# Patient Record
Sex: Male | Born: 1945
Health system: Southern US, Community
[De-identification: ages and names within clinical notes are randomized; demographics above are authoritative.]

## PROBLEM LIST (undated history)

## (undated) DIAGNOSIS — G459 Transient cerebral ischemic attack, unspecified: Secondary | ICD-10-CM

## (undated) DIAGNOSIS — M1990 Unspecified osteoarthritis, unspecified site: Secondary | ICD-10-CM

## (undated) HISTORY — PX: KNEE SURGERY: SHX244

## (undated) HISTORY — PX: ROTATOR CUFF REPAIR: SHX139

## (undated) HISTORY — PX: BACK SURGERY: SHX140

---

## 2003-03-08 ENCOUNTER — Encounter (HOSPITAL_COMMUNITY): Admission: RE | Admit: 2003-03-08 | Discharge: 2003-04-07 | Payer: Self-pay | Admitting: Orthopedic Surgery

## 2003-04-11 ENCOUNTER — Encounter (HOSPITAL_COMMUNITY): Admission: RE | Admit: 2003-04-11 | Discharge: 2003-05-11 | Payer: Self-pay | Admitting: Orthopedic Surgery

## 2008-10-16 ENCOUNTER — Ambulatory Visit (HOSPITAL_COMMUNITY): Admission: RE | Admit: 2008-10-16 | Discharge: 2008-10-16 | Payer: Self-pay | Admitting: Family Medicine

## 2009-12-26 ENCOUNTER — Ambulatory Visit (HOSPITAL_BASED_OUTPATIENT_CLINIC_OR_DEPARTMENT_OTHER): Admission: RE | Admit: 2009-12-26 | Discharge: 2009-12-26 | Payer: Self-pay | Admitting: Orthopedic Surgery

## 2010-01-02 ENCOUNTER — Encounter (HOSPITAL_COMMUNITY): Admission: RE | Admit: 2010-01-02 | Discharge: 2010-02-01 | Payer: Self-pay | Admitting: Orthopedic Surgery

## 2010-02-04 ENCOUNTER — Encounter (HOSPITAL_COMMUNITY): Admission: RE | Admit: 2010-02-04 | Discharge: 2010-03-08 | Payer: Self-pay | Admitting: Orthopedic Surgery

## 2010-03-11 ENCOUNTER — Encounter (HOSPITAL_COMMUNITY): Admission: RE | Admit: 2010-03-11 | Discharge: 2010-04-10 | Payer: Self-pay | Admitting: Orthopedic Surgery

## 2010-12-23 ENCOUNTER — Encounter (HOSPITAL_BASED_OUTPATIENT_CLINIC_OR_DEPARTMENT_OTHER)
Admission: RE | Admit: 2010-12-23 | Discharge: 2010-12-23 | Disposition: A | Discharge status: Home / Self Care | Payer: 59 | Source: Ambulatory Visit | Attending: Orthopedic Surgery | Admitting: Orthopedic Surgery

## 2010-12-23 DIAGNOSIS — Z01810 Encounter for preprocedural cardiovascular examination: Secondary | ICD-10-CM | POA: Insufficient documentation

## 2010-12-26 ENCOUNTER — Ambulatory Visit (HOSPITAL_BASED_OUTPATIENT_CLINIC_OR_DEPARTMENT_OTHER)
Admission: RE | Admit: 2010-12-26 | Discharge: 2010-12-26 | Disposition: A | Discharge status: Home / Self Care | Payer: 59 | Source: Ambulatory Visit | Attending: Orthopedic Surgery | Admitting: Orthopedic Surgery

## 2010-12-26 DIAGNOSIS — Z01812 Encounter for preprocedural laboratory examination: Secondary | ICD-10-CM | POA: Insufficient documentation

## 2010-12-26 DIAGNOSIS — M23359 Other meniscus derangements, posterior horn of lateral meniscus, unspecified knee: Secondary | ICD-10-CM | POA: Insufficient documentation

## 2010-12-26 DIAGNOSIS — M23305 Other meniscus derangements, unspecified medial meniscus, unspecified knee: Secondary | ICD-10-CM | POA: Insufficient documentation

## 2010-12-26 DIAGNOSIS — M2340 Loose body in knee, unspecified knee: Secondary | ICD-10-CM | POA: Insufficient documentation

## 2010-12-26 DIAGNOSIS — F17200 Nicotine dependence, unspecified, uncomplicated: Secondary | ICD-10-CM | POA: Insufficient documentation

## 2010-12-26 LAB — POCT HEMOGLOBIN-HEMACUE: Hemoglobin: 16.3 g/dL (ref 13.0–17.0)

## 2010-12-30 NOTE — Op Note (Signed)
  NAME:  CHOU, BUSLER NO.:  [id number]  MEDICAL RECORD NO.:  [id number]            PATIENT TYPE:  LOCATION:                                 FACILITY:  PHYSICIAN:  Loreta Ave, M.D.      DATE OF BIRTH:  DATE OF PROCEDURE:  12/26/2010 DATE OF DISCHARGE:                              OPERATIVE REPORT   PREOPERATIVE DIAGNOSIS:  Left knee medial and lateral meniscus tears.  POSTOPERATIVE DIAGNOSIS:  Left knee medial and lateral meniscus tears, also with grade 3 changes tricompartmental.  PROCEDURE:  Left knee exam under anesthesia, arthroscopy, partial medial and lateral meniscectomy.  Tricompartmental chondroplasty for grade 3 changes.  SURGEON:  Loreta Ave, MD  ASSISTANT:  Genene Churn. Barry Dienes, PA  ANESTHESIA:  General.  BLOOD LOSS:  Minimal.  SPECIMEN:  None.  CULTURES:  None.  COMPLICATIONS:  None.  DRESSING:  Soft compressive.  TOURNIQUET:  Not employed.  PROCEDURE:  The patient brought to the operating room, placed on the operating table in supine position.  After adequate anesthesia had been obtained, tourniquet and leg holder applied.  Leg prepped and draped in usual sterile fashion.  Good motion and stability confirmed.  Two portals, one each medial and lateral parapatellar.  Arthroscope introduced.  Knee distended and inspected.  Good patellofemoral tracking but grade 2 changes on the patella and diffuse grade 3 changes centrally in the trochlea.  Chondroplasty to a stable surface.  Chondral loose bodies removed.  Cruciate ligaments intact.  Medial compartment and extensive complex tearing medial meniscus, portions of this flipped up behind the condyle over into the notch.  Almost all of the posterior half removed, tapered into main meniscus.  Diffuse grade 3 changes over most of weightbearing dome of the condyle, debrided to a stable surface. Tibial plateau looked reasonable.  Lateral meniscus had complex tearing throughout the  middle and posterior third.  Saucerized out to a stable rim, tapered into remaining meniscus.  There were some grade 3 changes on the margin of the condyle but the rest of the compartment reasonably good.  At completion, all recess examined to be sure all chondral fragments removed.  Instruments and fluid removed.  Knee injected with Depo-Medrol and Marcaine.  Portals closed with nylon.  Sterile compressive dressing applied.  Anesthesia reversed. Brought to recovery room.  Tolerated surgery well.  No complications.     Loreta Ave, M.D.     DFM/MEDQ  D:  12/26/2010  T:  12/27/2010  Job:  [id number]  Electronically Signed by Mckinley Jewel M.D. on 12/30/2010 02:59:47 PM

## 2012-11-10 DIAGNOSIS — G459 Transient cerebral ischemic attack, unspecified: Secondary | ICD-10-CM

## 2012-11-10 HISTORY — DX: Transient cerebral ischemic attack, unspecified: G45.9

## 2015-02-23 ENCOUNTER — Emergency Department (HOSPITAL_COMMUNITY): Payer: Medicare Other

## 2015-02-23 ENCOUNTER — Observation Stay (HOSPITAL_COMMUNITY): Payer: Medicare Other

## 2015-02-23 ENCOUNTER — Emergency Department (HOSPITAL_COMMUNITY)
Admission: EM | Admit: 2015-02-23 | Discharge: 2015-02-23 | Disposition: A | Discharge status: Home / Self Care | Payer: Self-pay | Source: Home / Self Care

## 2015-02-23 ENCOUNTER — Encounter (HOSPITAL_COMMUNITY): Payer: Self-pay | Admitting: *Deleted

## 2015-02-23 ENCOUNTER — Observation Stay (HOSPITAL_COMMUNITY)
Admission: EM | Admit: 2015-02-23 | Discharge: 2015-02-24 | Disposition: A | Discharge status: Home / Self Care | Payer: Medicare Other | Attending: Internal Medicine | Admitting: Internal Medicine

## 2015-02-23 DIAGNOSIS — R202 Paresthesia of skin: Secondary | ICD-10-CM | POA: Diagnosis not present

## 2015-02-23 DIAGNOSIS — I10 Essential (primary) hypertension: Secondary | ICD-10-CM

## 2015-02-23 DIAGNOSIS — G458 Other transient cerebral ischemic attacks and related syndromes: Secondary | ICD-10-CM | POA: Diagnosis not present

## 2015-02-23 DIAGNOSIS — Z720 Tobacco use: Secondary | ICD-10-CM | POA: Diagnosis present

## 2015-02-23 DIAGNOSIS — R739 Hyperglycemia, unspecified: Secondary | ICD-10-CM | POA: Diagnosis not present

## 2015-02-23 DIAGNOSIS — D696 Thrombocytopenia, unspecified: Secondary | ICD-10-CM | POA: Diagnosis present

## 2015-02-23 DIAGNOSIS — G459 Transient cerebral ischemic attack, unspecified: Principal | ICD-10-CM | POA: Diagnosis present

## 2015-02-23 DIAGNOSIS — R209 Unspecified disturbances of skin sensation: Secondary | ICD-10-CM | POA: Diagnosis present

## 2015-02-23 DIAGNOSIS — E785 Hyperlipidemia, unspecified: Secondary | ICD-10-CM | POA: Diagnosis not present

## 2015-02-23 DIAGNOSIS — F17210 Nicotine dependence, cigarettes, uncomplicated: Secondary | ICD-10-CM | POA: Diagnosis not present

## 2015-02-23 DIAGNOSIS — R200 Anesthesia of skin: Secondary | ICD-10-CM | POA: Diagnosis not present

## 2015-02-23 LAB — COMPREHENSIVE METABOLIC PANEL
ALT: 19 U/L (ref 0–53)
AST: 26 U/L (ref 0–37)
Albumin: 3.8 g/dL (ref 3.5–5.2)
Alkaline Phosphatase: 87 U/L (ref 39–117)
Anion gap: 10 (ref 5–15)
BUN: 20 mg/dL (ref 6–23)
CHLORIDE: 104 mmol/L (ref 96–112)
CO2: 24 mmol/L (ref 19–32)
CREATININE: 0.81 mg/dL (ref 0.50–1.35)
Calcium: 9.3 mg/dL (ref 8.4–10.5)
GFR calc Af Amer: >90 mL/min (ref >90)
Glucose, Bld: 82 mg/dL (ref 70–99)
Potassium: 3.8 mmol/L (ref 3.5–5.1)
Sodium: 138 mmol/L (ref 135–145)
Total Bilirubin: 0.9 mg/dL (ref 0.3–1.2)
Total Protein: 6.6 g/dL (ref 6.0–8.3)

## 2015-02-23 LAB — CBC
HCT: 45 % (ref 39.0–52.0)
Hemoglobin: 15.6 g/dL (ref 13.0–17.0)
MCH: 31.9 pg (ref 26.0–34.0)
MCHC: 34.7 g/dL (ref 30.0–36.0)
MCV: 92 fL (ref 78.0–100.0)
PLATELETS: 138 10*3/uL — AB (ref 150–400)
RBC: 4.89 MIL/uL (ref 4.22–5.81)
RDW: 13.3 % (ref 11.5–15.5)
WBC: 9.3 10*3/uL (ref 4.0–10.5)

## 2015-02-23 LAB — URINALYSIS, ROUTINE W REFLEX MICROSCOPIC
BILIRUBIN URINE: NEGATIVE
GLUCOSE, UA: NEGATIVE mg/dL
Hgb urine dipstick: NEGATIVE
Ketones, ur: 15 mg/dL — AB
Leukocytes, UA: NEGATIVE
NITRITE: NEGATIVE
PH: 7 (ref 5.0–8.0)
Protein, ur: NEGATIVE mg/dL
SPECIFIC GRAVITY, URINE: 1.01 (ref 1.005–1.030)
Urobilinogen, UA: 0.2 mg/dL (ref 0.0–1.0)

## 2015-02-23 LAB — DIFFERENTIAL
Basophils Absolute: 0 10*3/uL (ref 0.0–0.1)
Basophils Relative: 0 % (ref 0–1)
EOS PCT: 2 % (ref 0–5)
Eosinophils Absolute: 0.2 10*3/uL (ref 0.0–0.7)
LYMPHS ABS: 2.3 10*3/uL (ref 0.7–4.0)
LYMPHS PCT: 25 % (ref 12–46)
MONO ABS: 0.7 10*3/uL (ref 0.1–1.0)
Monocytes Relative: 7 % (ref 3–12)
Neutro Abs: 6.1 10*3/uL (ref 1.7–7.7)
Neutrophils Relative %: 66 % (ref 43–77)

## 2015-02-23 LAB — PROTIME-INR
INR: 1.15 (ref 0.00–1.49)
Prothrombin Time: 14.8 seconds (ref 11.6–15.2)

## 2015-02-23 LAB — APTT: aPTT: 36 seconds (ref 24–37)

## 2015-02-23 LAB — RAPID URINE DRUG SCREEN, HOSP PERFORMED
Amphetamines: NOT DETECTED
Barbiturates: NOT DETECTED
Benzodiazepines: NOT DETECTED
COCAINE: NOT DETECTED
OPIATES: NOT DETECTED
Tetrahydrocannabinol: NOT DETECTED

## 2015-02-23 LAB — GLUCOSE, CAPILLARY: GLUCOSE-CAPILLARY: 145 mg/dL — AB (ref 70–99)

## 2015-02-23 LAB — TROPONIN I

## 2015-02-23 LAB — ETHANOL

## 2015-02-23 MED ORDER — HEPARIN SODIUM (PORCINE) 5000 UNIT/ML IJ SOLN: 5000.0000 [IU] | Freq: Three times a day (TID) | INTRAMUSCULAR | Status: DC

## 2015-02-23 MED ORDER — STROKE: EARLY STAGES OF RECOVERY BOOK
Freq: Once | Status: AC
  Administered 2015-02-23: 18:00:00

## 2015-02-23 MED ORDER — ENOXAPARIN SODIUM 40 MG/0.4ML ~~LOC~~ SOLN: 40.0000 mg | SUBCUTANEOUS | Status: DC

## 2015-02-23 MED ORDER — ASPIRIN 325 MG PO TABS
325.0000 mg | ORAL_TABLET | Freq: Every day | ORAL | Status: DC
  Administered 2015-02-23: 325 mg via ORAL
  Filled 2015-02-23: qty 1

## 2015-02-23 MED ORDER — ASPIRIN 81 MG PO CHEW
81.0000 mg | CHEWABLE_TABLET | Freq: Every day | ORAL | Status: DC
Start: 2015-02-24 — End: 2015-02-24
  Administered 2015-02-24: 81 mg via ORAL
  Filled 2015-02-23: qty 1

## 2015-02-23 MED ORDER — HEPARIN SODIUM (PORCINE) 5000 UNIT/ML IJ SOLN
5000.0000 [IU] | Freq: Three times a day (TID) | INTRAMUSCULAR | Status: DC
Start: 2015-02-24 — End: 2015-02-24
  Administered 2015-02-24: 5000 [IU] via SUBCUTANEOUS
  Filled 2015-02-23: qty 1

## 2015-02-23 NOTE — ED Notes (Signed)
Pt reports having an episode today of numbness/tingling to left arm and leg. Episode started around 1130 and lasted approx 1 hour. Denies having any symptoms at this time. Grips are equal, speech clear,  No facial droop noted.

## 2015-02-23 NOTE — ED Provider Notes (Signed)
CSN: [id number]     Arrival date & time 02/23/15  1441 History   First MD Initiated Contact with Patient 02/23/15 [phone]     Chief Complaint  Patient presents with  . Numbness     Patient is a 69 y.o. male presenting with general illness. The history is provided by the patient.  Illness Location:  Left face, left arm, left leg Quality:  Numbness Severity:  Moderate Onset quality:  Sudden Duration: 15 minutes. Timing:  Constant Progression:  Resolved Chronicity:  New Relieved by:  Nothing Worsened by:  Nothing Associated symptoms: no abdominal pain, no chest pain, no fatigue, no fever, no headaches, no loss of consciousness, no shortness of breath and no vomiting   Patient presents with left sided numbness He reports he has had multiple episodes of left face/arm/leg numbness Now resolved Episodes lasted up to 15 minutes No weakness No slurred speech No h/o CVA He now feels at baseline     PMH - none Soc hx - smoker Family history - negative for stroke  History  Substance Use Topics  . Smoking status: Current Every Day Smoker    Types: Cigarettes  . Smokeless tobacco: Not on file  . Alcohol Use: No    Review of Systems  Constitutional: Negative for fever and fatigue.  Eyes: Negative for visual disturbance.  Respiratory: Negative for shortness of breath.   Cardiovascular: Negative for chest pain.  Gastrointestinal: Negative for vomiting and abdominal pain.  Musculoskeletal: Negative for neck pain.  Neurological: Positive for numbness. Negative for loss of consciousness, weakness and headaches.  All other systems reviewed and are negative.     Allergies  Review of patient's allergies indicates no known allergies.  Home Medications   Prior to Admission medications   Not on File   BP 173/95 mmHg  Pulse 63  Temp(Src) 97.8 F (36.6 C) (Oral)  Resp 18  Ht 6\' 2"  (1.88 m)  SpO2 97% Physical Exam CONSTITUTIONAL: Well developed/well nourished HEAD:  Normocephalic/atraumatic EYES: EOMI/PERRL, no nystagmus,  no ptosis ENMT: Mucous membranes moist NECK: supple no meningeal signs, no bruits CV: S1/S2 noted, no murmurs/rubs/gallops noted LUNGS: Lungs are clear to auscultation bilaterally, no apparent distress ABDOMEN: soft, nontender, no rebound or guarding GU:no cva tenderness NEURO:Awake/alert, facies symmetric, no arm or leg drift is noted Equal 5/5 strength with shoulder abduction, elbow flex/extension, wrist flex/extension in upper extremities and equal hand grips bilaterally Equal 5/5 strength with hip flexion,knee flex/extension, foot dorsi/plantar flexion Cranial nerves 3/4/5/6/05/18/09/11/12 tested and intact No past pointing Sensation to light touch intact in all extremities EXTREMITIES: pulses normal, full ROM SKIN: warm, color normal PSYCH: no abnormalities of mood noted  ED Course  Procedures  3:22 PM tPA in stroke considered but not given due to: Symptoms resolved 4:41 PM CT head negative Pt symptom free Will admit for TIA  Labs Review Labs Reviewed  CBC - Abnormal; Notable for the following:    Platelets 138 (*)    All other components within normal limits  PROTIME-INR  APTT  DIFFERENTIAL  ETHANOL  COMPREHENSIVE METABOLIC PANEL  URINE RAPID DRUG SCREEN (HOSP PERFORMED)  URINALYSIS, ROUTINE W REFLEX MICROSCOPIC  TROPONIN I    Imaging Review Ct Head Wo Contrast  02/23/2015   CLINICAL DATA:  Left-sided numbness/tingling  EXAM: CT HEAD WITHOUT CONTRAST  TECHNIQUE: Contiguous axial images were obtained from the base of the skull through the vertex without intravenous contrast.  COMPARISON:  None.  FINDINGS: No evidence of parenchymal hemorrhage or extra-axial  fluid collection. No mass lesion, mass effect, or midline shift.  No CT evidence of acute infarction.  Cerebral volume is within normal limits.  No ventriculomegaly.  The visualized paranasal sinuses are essentially clear. Partial opacification of the right  mastoid air cells.  No evidence of calvarial fracture.  IMPRESSION: Normal head CT.   Electronically Signed   By: Julian Hy M.D.   On: 02/23/2015 15:54     EKG Interpretation   Date/Time:  Friday February 23 2015 16:01:49 EDT Ventricular Rate:  61 PR Interval:  161 QRS Duration: 110 QT Interval:  446 QTC Calculation: 449 R Axis:   26 Text Interpretation:  Sinus rhythm Consider left atrial enlargement  Probable left ventricular hypertrophy No significant change since last  tracing Confirmed by Christy Gentles  MD, Elenore Rota ([id number]) on 02/23/2015 4:28:51 PM      MDM   Final diagnoses:  Transient cerebral ischemia, unspecified transient cerebral ischemia type    Nursing notes including past medical history and social history reviewed and considered in documentation Labs/vital reviewed myself and considered during evaluation  .    Ripley Fraise, MD 02/23/15 [phone]

## 2015-02-23 NOTE — Progress Notes (Signed)
Pt received from ED alert, verbal with no noted distress. He denies pain or discomfort. Denies numbness or tingling but states that he did have numbness and tingling to his left upper and lower extremities prior to driving self to ED. Marland Kitchen Pt stable, neuro intact.  Able to move all extremities. Pt oriented to room. Safety measures in place. Call bell within reach. Will continue to monitor.

## 2015-02-23 NOTE — H&P (Signed)
Date: 02/23/2015               Patient Name:  Phillip Richardson MRN: [id number]  DOB: [birth year] Age / Sex: 69 y.o., male   PCP: No primary care provider on file.         Medical Service: Internal Medicine Teaching Service         Attending Physician: Dr. Madilyn Fireman, MD    First Contact: Dr. Randell Patient Pager: [phone]  Second Contact: Dr. Naaman Plummer Pager: [phone]       After Hours (After 5p/  First Contact Pager: [phone]  weekends / holidays): Second Contact Pager: [phone]   Chief Complaint: Left sided numbness/tingling  History of Present Illness: Phillip Richardson is a 69 year old tobacco user presenting with left sided numbness/tingling. He reports his symptoms started around noon today with numbness on the left side of his face, tingling in his left fingers, and numbness in the left leg. This lasted for 15-20 minutes. It returned 1 hour later and lasted again for 15-20 minutes. Last episode was at [facility] with tingling in fingers. Symptoms have all resolved completely. Denies weakness. Denies past episodes. Denies fevers, chills, vision changes, trouble swallowing, coughing, blurred speech, dizziness, lightheadedness.  Meds: MVI Fish oil  Allergies: Allergies as of 02/23/2015  . (No Known Allergies)   History reviewed. No pertinent past medical history. History reviewed. No pertinent past surgical history. History reviewed. No pertinent family history. Denies family history of CVA.  History   Social History  . Marital Status: Widowed    Spouse Name: N/A  . Number of Children: N/A  . Years of Education: N/A   Occupational History  . Not on file.   Social History Main Topics  . Smoking status: Current Every Day Smoker    Types: Cigarettes  . Smokeless tobacco: Not on file  . Alcohol Use: No  . Drug Use: No  . Sexual Activity: Not on file   Other Topics Concern  . Not on file   Social History Narrative  . No narrative on file  Tobacco: 0.5 PPD for 50-60  years EtOH: occasional beer Retired, previously worked in [employer] [place]: Constitutional: no fevers/chills, no weight loss, no change in appetite Eyes: no vision changes Ears, nose, mouth, throat, and face: no cough, no dysphagia, no aphasia Respiratory: no shortness of breath Cardiovascular: no chest pain Gastrointestinal: no nausea/vomiting, no abdominal pain, no constipation, no diarrhea Genitourinary: no dysuria, no hematuria Integument: no rash Hematologic/lymphatic: no bleeding/bruising, no edema Musculoskeletal: no arthralgias, no myalgias Neurological: no paresthesias, no weakness  Physical Exam: Blood pressure 175/95, pulse 64, temperature 97.7 F (36.5 C), temperature source Oral, resp. rate 18, height 6\' 2"  (1.88 m), SpO2 96 %. General Apperance: NAD Head: Normocephalic, atraumatic Eyes: PERRL, EOMI, anicteric sclera Ears: Normal external ear canal Nose: Nares normal, septum midline, mucosa normal Throat: Lips, mucosa and tongue normal  Neck: Supple, trachea midline Back: No tenderness or bony abnormality  Lungs: Clear to auscultation bilaterally. No wheezes, rhonchi or rales. Breathing comfortably on RA Chest Wall: Nontender, no deformity Heart: Regular rate and rhythm, no murmur/rub/gallop Abdomen: Soft, nontender, nondistended, no rebound/guarding Extremities: Normal, atraumatic, warm and well perfused, no edema Pulses: 2+ throughout Skin: No rashes or lesions Neurologic: Alert and oriented x 3. Speech fluent without aphasia. CNII-XII intact. Normal strength and sensation bilaterally. DTR 1+ and symmetric.   Lab results: Basic Metabolic Panel:  Recent Labs  02/23/15 1534  NA 138  K 3.8  CL 104  CO2 24  GLUCOSE 82  BUN 20  CREATININE 0.81  CALCIUM 9.3   Liver Function Tests:  Recent Labs  02/23/15 1534  AST 26  ALT 19  ALKPHOS 87  BILITOT 0.9  PROT 6.6  ALBUMIN 3.8   CBC:  Recent Labs  02/23/15 1534  WBC 9.3   NEUTROABS 6.1  HGB 15.6  HCT 45.0  MCV 92.0  PLT 138*   Cardiac Enzymes:  Recent Labs  02/23/15 1534  TROPONINI <0.03   Coagulation:  Recent Labs  02/23/15 1534  LABPROT 14.8  INR 1.15   Urine Drug Screen: Drugs of Abuse     Component Value Date/Time   LABOPIA NONE DETECTED 02/23/2015 1625   COCAINSCRNUR NONE DETECTED 02/23/2015 1625   LABBENZ NONE DETECTED 02/23/2015 1625   AMPHETMU NONE DETECTED 02/23/2015 1625   THCU NONE DETECTED 02/23/2015 1625   LABBARB NONE DETECTED 02/23/2015 1625    Alcohol Level:  Recent Labs  02/23/15 1534  ETH <5   Urinalysis:  Recent Labs  02/23/15 1625  COLORURINE YELLOW  LABSPEC 1.010  PHURINE 7.0  GLUCOSEU NEGATIVE  HGBUR NEGATIVE  BILIRUBINUR NEGATIVE  KETONESUR 15*  PROTEINUR NEGATIVE  UROBILINOGEN 0.2  NITRITE NEGATIVE  LEUKOCYTESUR NEGATIVE     Imaging results:  Ct Head Wo Contrast  02/23/2015   CLINICAL DATA:  Left-sided numbness/tingling  EXAM: CT HEAD WITHOUT CONTRAST  TECHNIQUE: Contiguous axial images were obtained from the base of the skull through the vertex without intravenous contrast.  COMPARISON:  None.  FINDINGS: No evidence of parenchymal hemorrhage or extra-axial fluid collection. No mass lesion, mass effect, or midline shift.  No CT evidence of acute infarction.  Cerebral volume is within normal limits.  No ventriculomegaly.  The visualized paranasal sinuses are essentially clear. Partial opacification of the right mastoid air cells.  No evidence of calvarial fracture.  IMPRESSION: Normal head CT.   Electronically Signed   By: Julian Hy M.D.   On: 02/23/2015 15:54    Other results: EKG: Normal sinus rhythm. No previous EKG for comparison.  Assessment & Plan by Problem: Active Problems:   TIA (transient ischemic attack)  TIA: He has a risk factor for stroke which is active smoking. CT head demonstrates no acute processes. EKG demonstrates no acute ischemic changes. Initial troponin  negative. ABCD2 score 3 - low 2 day stroke risk (1 percent) -Carotid dopplers -Echo -MRI/MRA head -Obtain HgbA1c, fasting lipid panel -PT/OT, Speech consult  -ASA 325mg  daily -Telemetry monitoring -Neurology consulted  Hypertension: No previous diagnosis. BP 175/93 on admission.  -Allow permissive hypertension  FEN: Regular diet DVT ppx: SCDs  Dispo: Disposition is deferred at this time, awaiting improvement of current medical problems. Anticipated discharge in approximately 1-2 day(s).   The patient does not have a current PCP (No primary care provider on file.) and does not need an [facility] hospital follow-up appointment after discharge.  The patient does not have transportation limitations that hinder transportation to clinic appointments.  Signed: Milagros Loll, MD 02/23/2015, 4:40 PM

## 2015-02-23 NOTE — ED Notes (Signed)
Patient states he had an episode of numbness and tingling on the left side of his body. States that his face "felt like I had been to the dentist and I had some tingling in my hand." pt states he remember episode of numbness, denies any confusion, weakness in any limb or trouble speaking. States this is the first time he has ever had an episode like this, denies headache at this time. Pt alert, oriented x 4

## 2015-02-23 NOTE — Consult Note (Signed)
Referring Physician: Dr. Ellwood Dense    Chief Complaint: TIA (left arm-face-leg numbness)  HPI:                                                                                                                                         Phillip Richardson is an 69 y.o. male with a past medical history significant for smoking, admitted to [facility] after sustaining a transient episode of left sided paresthesias. Patient said that he never had similar symptoms before. Stated that he was driving when suddenly noticed a numb-tingly sensation of the fingers left hand and almost simultaneously the same sensation developed in his entire left arm-leg-face. Said that " it was like when you got a novocain shot at the dentist office". The abnormal sensation lasted for 10 or 15 minutes but came back again1 hour later and lasted again for 15-20 minutes. Last episode was at [facility] with tingling in fingers. Denies associated HA, vertigo, double vision, difficulty swallowing, focal weakness, slurred speech, language or vision impairment. Said that as soon as he got home he took 2 aspirin. CT brain was personally reviewed and showed no acute abnormality. CBC, metabolic panel, UDS,and ETOH level were unrevealing.  Date last known well: 02/23/15 Time last known well: unclear tPA Given: no, symptoms resolved  History reviewed. No pertinent past medical history.  History reviewed. No pertinent past surgical history.  History reviewed. No pertinent family history. Social History:  reports that he has been smoking Cigarettes.  He does not have any smokeless tobacco history on file. He reports that he does not drink alcohol or use illicit drugs. Family history: no brain tumors, brain aneurysms, or epilepsy Allergies: No Known Allergies  Medications:                                                                                                                           Scheduled: . [COMPLETED]  stroke: mapping our early stages of  recovery book   Does not apply Once  . aspirin  325 mg Oral Daily    ROS:  History obtained from the patient and chart review  General ROS: negative for - chills, fatigue, fever, night sweats, weight gain or weight loss Psychological ROS: negative for - behavioral disorder, hallucinations, memory difficulties, mood swings or suicidal ideation Ophthalmic ROS: negative for - blurry vision, double vision, eye pain or loss of vision ENT ROS: negative for - epistaxis, nasal discharge, oral lesions, sore throat, tinnitus or vertigo Allergy and Immunology ROS: negative for - hives or itchy/watery eyes Hematological and Lymphatic ROS: negative for - bleeding problems, bruising or swollen lymph nodes Endocrine ROS: negative for - galactorrhea, hair pattern changes, polydipsia/polyuria or temperature intolerance Respiratory ROS: negative for - cough, hemoptysis, shortness of breath or wheezing Cardiovascular ROS: negative for - chest pain, dyspnea on exertion, edema or irregular heartbeat Gastrointestinal ROS: negative for - abdominal pain, diarrhea, hematemesis, nausea/vomiting or stool incontinence Genito-Urinary ROS: negative for - dysuria, hematuria, incontinence or urinary frequency/urgency Musculoskeletal ROS: negative for - joint swelling or muscular weakness Neurological ROS: as noted in HPI Dermatological ROS: negative for rash and skin lesion changes   Physical exam: pleasant male in no apparent distress. Blood pressure 180/98, pulse 65, temperature 97.7 F (36.5 C), temperature source Oral, resp. rate 17, height $RemoveBe'6\' 2"'[id number]$  (1.88 m), SpO2 94 %. Head: normocephalic. Eyes: anicteric sclerae  Ear: no discharge Neck: supple, no bruits, no JVD. Cardiac: no murmurs. Lungs: clear. Abdomen: soft, no tender, no mass. Extremities: no edema. CV: pulses palpable  throughout  Skin: no rash  Neurologic Examination:                                                                                                      General: Mental Status: Alert, oriented, thought content appropriate.  Speech fluent without evidence of aphasia.  Able to follow 3 step commands without difficulty. Cranial Nerves: II: Discs flat bilaterally; Visual fields grossly normal, pupils equal, round, reactive to light and accommodation III,IV, VI: ptosis not present, extra-ocular motions intact bilaterally V,VII: smile symmetric, facial light touch sensation normal bilaterally VIII: hearing normal bilaterally IX,X: uvula rises symmetrically XI: bilateral shoulder shrug XII: midline tongue extension without atrophy or fasciculations  Motor: Right : Upper extremity   5/5    Left:     Upper extremity   5/5  Lower extremity   5/5     Lower extremity   5/5 Tone and bulk:normal tone throughout; no atrophy noted Sensory: Pinprick and light touch intact throughout, bilaterally Deep Tendon Reflexes:  Right: Upper Extremity   Left: Upper extremity   biceps (C-5 to C-6) 2/4   biceps (C-5 to C-6) 2/4 tricep (C7) 2/4    triceps (C7) 2/4 Brachioradialis (C6) 2/4  Brachioradialis (C6) 2/4  Lower Extremity Lower Extremity  quadriceps (L-2 to L-4) 2/4   quadriceps (L-2 to L-4) 2/4 Achilles (S1) 2/4   Achilles (S1) 2/4  Plantars: Right: downgoing   Left: downgoing Cerebellar: normal finger-to-nose,  normal heel-to-shin test Gait:  No tested due to multiple leads, safety resons    Results for orders placed or performed during the hospital encounter of 02/23/15 (from  the past 48 hour(s))  Ethanol     Status: None   Collection Time: 02/23/15  3:34 PM  Result Value Ref Range   Alcohol, Ethyl (B) <5 0 - 9 mg/dL    Comment:        LOWEST DETECTABLE LIMIT FOR SERUM ALCOHOL IS 11 mg/dL FOR MEDICAL PURPOSES ONLY   Protime-INR     Status: None   Collection Time: 02/23/15  3:34 PM   Result Value Ref Range   Prothrombin Time 14.8 11.6 - 15.2 seconds   INR 1.15 0.00 - 1.49  APTT     Status: None   Collection Time: 02/23/15  3:34 PM  Result Value Ref Range   aPTT 36 24 - 37 seconds  CBC     Status: Abnormal   Collection Time: 02/23/15  3:34 PM  Result Value Ref Range   WBC 9.3 4.0 - 10.5 K/uL   RBC 4.89 4.22 - 5.81 MIL/uL   Hemoglobin 15.6 13.0 - 17.0 g/dL   HCT 45.0 39.0 - 52.0 %   MCV 92.0 78.0 - 100.0 fL   MCH 31.9 26.0 - 34.0 pg   MCHC 34.7 30.0 - 36.0 g/dL   RDW 13.3 11.5 - 15.5 %   Platelets 138 (L) 150 - 400 K/uL  Differential     Status: None   Collection Time: 02/23/15  3:34 PM  Result Value Ref Range   Neutrophils Relative % 66 43 - 77 %   Neutro Abs 6.1 1.7 - 7.7 K/uL   Lymphocytes Relative 25 12 - 46 %   Lymphs Abs 2.3 0.7 - 4.0 K/uL   Monocytes Relative 7 3 - 12 %   Monocytes Absolute 0.7 0.1 - 1.0 K/uL   Eosinophils Relative 2 0 - 5 %   Eosinophils Absolute 0.2 0.0 - 0.7 K/uL   Basophils Relative 0 0 - 1 %   Basophils Absolute 0.0 0.0 - 0.1 K/uL  Comprehensive metabolic panel     Status: None   Collection Time: 02/23/15  3:34 PM  Result Value Ref Range   Sodium 138 135 - 145 mmol/L   Potassium 3.8 3.5 - 5.1 mmol/L   Chloride 104 96 - 112 mmol/L   CO2 24 19 - 32 mmol/L   Glucose, Bld 82 70 - 99 mg/dL   BUN 20 6 - 23 mg/dL   Creatinine, Ser 0.81 0.50 - 1.35 mg/dL   Calcium 9.3 8.4 - 10.5 mg/dL   Total Protein 6.6 6.0 - 8.3 g/dL   Albumin 3.8 3.5 - 5.2 g/dL   AST 26 0 - 37 U/L   ALT 19 0 - 53 U/L   Alkaline Phosphatase 87 39 - 117 U/L   Total Bilirubin 0.9 0.3 - 1.2 mg/dL   GFR calc non Af Amer >90 >90 mL/min   GFR calc Af Amer >90 >90 mL/min    Comment: (NOTE) The eGFR has been calculated using the CKD EPI equation. This calculation has not been validated in all clinical situations. eGFR's persistently <90 mL/min signify possible Chronic Kidney Disease.    Anion gap 10 5 - 15  Troponin I     Status: None   Collection Time:  02/23/15  3:34 PM  Result Value Ref Range   Troponin I <0.03 <0.031 ng/mL    Comment:        NO INDICATION OF MYOCARDIAL INJURY.   Urine Drug Screen     Status: None   Collection Time: 02/23/15  4:25 PM  Result Value Ref Range   Opiates NONE DETECTED NONE DETECTED   Cocaine NONE DETECTED NONE DETECTED   Benzodiazepines NONE DETECTED NONE DETECTED   Amphetamines NONE DETECTED NONE DETECTED   Tetrahydrocannabinol NONE DETECTED NONE DETECTED   Barbiturates NONE DETECTED NONE DETECTED    Comment:        DRUG SCREEN FOR MEDICAL PURPOSES ONLY.  IF CONFIRMATION IS NEEDED FOR ANY PURPOSE, NOTIFY LAB WITHIN 5 DAYS.        LOWEST DETECTABLE LIMITS FOR URINE DRUG SCREEN Drug Class       Cutoff (ng/mL) Amphetamine      1000 Barbiturate      200 Benzodiazepine   315 Tricyclics       176 Opiates          300 Cocaine          300 THC              50   Urinalysis, Routine w reflex microscopic     Status: Abnormal   Collection Time: 02/23/15  4:25 PM  Result Value Ref Range   Color, Urine YELLOW YELLOW   APPearance CLEAR CLEAR   Specific Gravity, Urine 1.010 1.005 - 1.030   pH 7.0 5.0 - 8.0   Glucose, UA NEGATIVE NEGATIVE mg/dL   Hgb urine dipstick NEGATIVE NEGATIVE   Bilirubin Urine NEGATIVE NEGATIVE   Ketones, ur 15 (A) NEGATIVE mg/dL   Protein, ur NEGATIVE NEGATIVE mg/dL   Urobilinogen, UA 0.2 0.0 - 1.0 mg/dL   Nitrite NEGATIVE NEGATIVE   Leukocytes, UA NEGATIVE NEGATIVE    Comment: MICROSCOPIC NOT DONE ON URINES WITH NEGATIVE PROTEIN, BLOOD, LEUKOCYTES, NITRITE, OR GLUCOSE <1000 mg/dL.   Ct Head Wo Contrast  02/23/2015   CLINICAL DATA:  Left-sided numbness/tingling  EXAM: CT HEAD WITHOUT CONTRAST  TECHNIQUE: Contiguous axial images were obtained from the base of the skull through the vertex without intravenous contrast.  COMPARISON:  None.  FINDINGS: No evidence of parenchymal hemorrhage or extra-axial fluid collection. No mass lesion, mass effect, or midline shift.  No CT  evidence of acute infarction.  Cerebral volume is within normal limits.  No ventriculomegaly.  The visualized paranasal sinuses are essentially clear. Partial opacification of the right mastoid air cells.  No evidence of calvarial fracture.  IMPRESSION: Normal head CT.   Electronically Signed   By: Julian Hy M.D.   On: 02/23/2015 15:54    Assessment: 69 y.o. male presents with acute onset of transient left arm-face-leg paresthesias that is completely resolved. Neuro-exam non focal. CT brain was personally reviewed and showed no acute abnormality. TIA likely involving right thalamus. TIA/stroke work up. Aspirin. Stroke team will follow up tomorrow.  Stroke Risk Factors -smoking  Plan: 1. HgbA1c, fasting lipid panel 2. MRI, MRA  of the brain without contrast 3. Echocardiogram 4. Carotid dopplers 5. Prophylactic therapy-aspirin 6. Risk factor modification 7. Telemetry monitoring 8. Frequent neuro checks 9. PT/OT SLP   Dorian Pod, MD [facility] [phone]  02/23/2015, 5:48 PM

## 2015-02-23 NOTE — ED Notes (Signed)
Patient transported to CT 

## 2015-02-24 DIAGNOSIS — G459 Transient cerebral ischemic attack, unspecified: Secondary | ICD-10-CM | POA: Diagnosis not present

## 2015-02-24 DIAGNOSIS — G458 Other transient cerebral ischemic attacks and related syndromes: Secondary | ICD-10-CM

## 2015-02-24 DIAGNOSIS — I10 Essential (primary) hypertension: Secondary | ICD-10-CM | POA: Diagnosis not present

## 2015-02-24 DIAGNOSIS — E785 Hyperlipidemia, unspecified: Secondary | ICD-10-CM | POA: Diagnosis present

## 2015-02-24 LAB — LIPID PANEL
Cholesterol: 199 mg/dL (ref 0–200)
HDL: 38 mg/dL — ABNORMAL LOW (ref >39)
LDL Cholesterol: 147 mg/dL — ABNORMAL HIGH (ref 0–99)
Total CHOL/HDL Ratio: 5.2 RATIO
Triglycerides: 69 mg/dL (ref <150)
VLDL: 14 mg/dL (ref 0–40)

## 2015-02-24 LAB — CBC
HEMATOCRIT: 47.2 % (ref 39.0–52.0)
HEMOGLOBIN: 15.9 g/dL (ref 13.0–17.0)
MCH: 31.5 pg (ref 26.0–34.0)
MCHC: 33.7 g/dL (ref 30.0–36.0)
MCV: 93.7 fL (ref 78.0–100.0)
PLATELETS: 154 10*3/uL (ref 150–400)
RBC: 5.04 MIL/uL (ref 4.22–5.81)
RDW: 13.4 % (ref 11.5–15.5)
WBC: 8.7 10*3/uL (ref 4.0–10.5)

## 2015-02-24 LAB — GLUCOSE, CAPILLARY
GLUCOSE-CAPILLARY: 83 mg/dL (ref 70–99)
GLUCOSE-CAPILLARY: 105 mg/dL — AB (ref 70–99)
Glucose-Capillary: 80 mg/dL (ref 70–99)

## 2015-02-24 LAB — MRSA PCR SCREENING: MRSA by PCR: NEGATIVE

## 2015-02-24 LAB — TSH: TSH: 2.804 u[IU]/mL (ref 0.350–4.500)

## 2015-02-24 LAB — TECHNOLOGIST SMEAR REVIEW

## 2015-02-24 LAB — HIV ANTIBODY (ROUTINE TESTING W REFLEX): HIV SCREEN 4TH GENERATION: NONREACTIVE

## 2015-02-24 MED ORDER — ASPIRIN 81 MG PO CHEW: 81.0000 mg | CHEWABLE_TABLET | Freq: Every day | ORAL | Status: DC

## 2015-02-24 MED ORDER — ATORVASTATIN CALCIUM 40 MG PO TABS
40.0000 mg | ORAL_TABLET | Freq: Every day | ORAL | Status: DC
  Administered 2015-02-24: 40 mg via ORAL
  Filled 2015-02-24: qty 1

## 2015-02-24 MED ORDER — ATORVASTATIN CALCIUM 40 MG PO TABS: 40.0000 mg | ORAL_TABLET | Freq: Every day | ORAL | Status: DC

## 2015-02-24 NOTE — Progress Notes (Signed)
VASCULAR LAB PRELIMINARY  PRELIMINARY  PRELIMINARY  PRELIMINARY  Carotid Dopplers completed.    Preliminary report:  1-39% ICA stenosisl.  Vertebral artery flow is antegrade.   Shreyan Hinz, RVT 02/24/2015, 1:17 PM

## 2015-02-24 NOTE — Progress Notes (Signed)
UR completed 

## 2015-02-24 NOTE — Progress Notes (Signed)
Pt d/c to home by car with family. Assessment stable. Pt verbalizes understanding of d/c instructions. Pt refuses to be d/c by wheelchair.

## 2015-02-24 NOTE — Discharge Instructions (Addendum)
Transient Ischemic Attack °A transient ischemic attack (TIA) is a "warning stroke" that causes stroke-like symptoms. A TIA does not cause lasting damage to the brain. It is important to know when to get help and what to do to prevent stroke or death.  °HOME CARE  °· Take all medicines exactly as told by your doctor. Understand all your medicine instructions. °· You may need to take aspirin or warfarin medicine. Take warfarin exactly as told. °¨ Taking too much or too little warfarin is dangerous. Blood tests must be done as often as told by your doctor. These blood tests help your doctor make sure the amount of warfarin you are taking is right. A PT blood test measures how long it takes for blood to clot. Your PT is used to calculate another value called an INR. Your PT and INR help your doctor adjust your warfarin dosage. °¨ Food can cause problems with warfarin and affect the results of your blood tests. This is true for foods high in vitamin K. Spinach, kale, broccoli, cabbage, collard and turnip greens, Brussels sprouts, peas, cauliflower, seaweed, and parsley are high in vitamin K as well as beef and pork liver, green tea, and soybean oil. Eat the same amount of food high in vitamin K. Avoid major changes in your diet. Tell your doctor before changing your diet. Talk to a food specialist (dietitian) if you have questions. °¨ Many medicines can cause problems with warfarin and affect your PT and INR. Tell your doctor about all medicines you take. This includes vitamins and dietary pills (supplements). Be careful with aspirin and medicines that relieve redness, soreness, and puffiness (inflammation). Do not take or stop medicines unless your doctor tells you to. °¨ Warfarin can cause a lot of bruising or bleeding. Hold pressure over cuts for longer than normal. Talk to your doctor about other side effects of warfarin. °¨ Avoid sports or activities that may cause injury or bleeding. °¨ Be careful when you shave,  floss your teeth, or use sharp objects. °¨ Avoid alcoholic drinks or drink very little alcohol while taking warfarin. Tell your doctor if you change how much alcohol you drink. °¨ Tell your dentist and other doctors that you take warfarin before procedures. °· Eat 5 or more servings of fruits and vegetables a day. °· Follow your diet program as told, if you are given one. °· Keep a healthy weight. °· Stay active. Try to get at least 30 minutes of activity on most or all days. °· Do not smoke. °· Limit how much alcohol you drink even if you are not taking warfarin. Moderate alcohol use is: °¨ No more than 2 drinks each day for men. °¨ No more than 1 drink each day for women who are not pregnant. °· Stop abusing drugs. °· Keep your home safe so you do not fall. Try: °¨ Putting grab bars in the bedroom and bathroom. °¨ Raising toilet seats. °¨ Putting a seat in the shower. °· Keep all doctor visits a told. °GET HELP IF: °· Your personality changes. °· You have trouble swallowing. °· You are seeing two of everything. °· You are dizzy. °· You have a fever. °· Your skin starts to break down. °GET HELP RIGHT AWAY IF:  °The symptoms below may be a sign of an emergency. Do not wait to see if the symptoms go away. Call for help (911 in [place] Do not drive yourself to the hospital. °· You have sudden weakness or numbness on   the face, arm, or leg (especially on one side of the body).  You have sudden trouble walking or moving your arms or legs.  You have sudden confusion.  You have trouble talking or understanding.  You have sudden trouble seeing in one or both eyes.  You lose your balance or your movements are not smooth.  You have a sudden, severe headache with no known cause.  You have new chest pain or you feel your heart beating in a unsteady way.  You are partly or totally unaware of what is going on around you. MAKE SURE YOU:   Understand these instructions.  Will watch your condition.  Will get  help right away if you are not doing well or get worse. Document Released: 08/05/2008 Document Revised: 03/13/2014 Document Reviewed: 02/01/2014 [facility] Patient Information 2015 [place], [place]. This information is not intended to replace advice given to you by your health care provider. Make sure you discuss any questions you have with your health care provider.  Transient Ischemic Attack A transient ischemic attack (TIA) is a "warning stroke" that causes stroke-like symptoms. Unlike a stroke, a TIA does not cause permanent damage to the brain. The symptoms of a TIA can happen very fast and do not last long. It is important to know the symptoms of a TIA and what to do. This can help prevent a major stroke or death. CAUSES   A TIA is caused by a temporary blockage in an artery in the brain or neck (carotid artery). The blockage does not allow the brain to get the blood supply it needs and can cause different symptoms. The blockage can be caused by either:  A blood clot.  Fatty buildup (plaque) in a neck or brain artery. RISK FACTORS  High blood pressure (hypertension).  High cholesterol.  Diabetes mellitus.  Heart disease.  The build up of plaque in the blood vessels (peripheral artery disease or atherosclerosis).  The build up of plaque in the blood vessels providing blood and oxygen to the brain (carotid artery stenosis).  An abnormal heart rhythm (atrial fibrillation).  Obesity.  Smoking.  Taking oral contraceptives (especially in combination with smoking).  Physical inactivity.  A diet high in fats, salt (sodium), and calories.  Alcohol use.  Use of illegal drugs (especially cocaine and methamphetamine).  Being male.  Being African American.  Being over the age of 46.  Family history of stroke.  Previous history of blood clots, stroke, TIA, or heart attack.  Sickle cell disease. SYMPTOMS  TIA symptoms are the same as a stroke but are temporary. These  symptoms usually develop suddenly, or may be newly present upon awakening from sleep:  Sudden weakness or numbness of the face, arm, or leg, especially on one side of the body.  Sudden trouble walking or difficulty moving arms or legs.  Sudden confusion.  Sudden personality changes.  Trouble speaking (aphasia) or understanding.  Difficulty swallowing.  Sudden trouble seeing in one or both eyes.  Double vision.  Dizziness.  Loss of balance or coordination.  Sudden severe headache with no known cause.  Trouble reading or writing.  Loss of bowel or bladder control.  Loss of consciousness. DIAGNOSIS  Your caregiver may be able to determine the presence or absence of a TIA based on your symptoms, history, and physical exam. Computed tomography (CT scan) of the brain is usually performed to help identify a TIA. Other tests may be done to diagnose a TIA. These tests may include:  Electrocardiography.  Continuous heart monitoring.  Echocardiography.  Carotid ultrasonography.  Magnetic resonance imaging (MRI).  A scan of the brain circulation.  Blood tests. PREVENTION  The risk of a TIA can be decreased by appropriately treating high blood pressure, high cholesterol, diabetes, heart disease, and obesity and by quitting smoking, limiting alcohol, and staying physically active. TREATMENT  Time is of the essence. Since the symptoms of TIA are the same as a stroke, it is important to seek treatment as soon as possible because you may need a medicine to dissolve the clot (thrombolytic) that cannot be given if too much time has passed. Treatment options vary. Treatment options may include rest, oxygen, intravenous (IV) fluids, and medicines to thin the blood (anticoagulants). Medicines and diet may be used to address diabetes, high blood pressure, and other risk factors. Measures will be taken to prevent short-term and long-term complications, including infection from breathing  foreign material into the lungs (aspiration pneumonia), blood clots in the legs, and falls. Treatment options include procedures to either remove plaque in the carotid arteries or dilate carotid arteries that have narrowed due to plaque. Those procedures are:  Carotid endarterectomy.  Carotid angioplasty and stenting. HOME CARE INSTRUCTIONS   Take all medicines prescribed by your caregiver. Follow the directions carefully. Medicines may be used to control risk factors for a stroke. Be sure you understand all your medicine instructions.  You may be told to take aspirin or the anticoagulant warfarin. Warfarin needs to be taken exactly as instructed.  Taking too much or too little warfarin is dangerous. Too much warfarin increases the risk of bleeding. Too little warfarin continues to allow the risk for blood clots. While taking warfarin, you will need to have regular blood tests to measure your blood clotting time. A PT blood test measures how long it takes for blood to clot. Your PT is used to calculate another value called an INR. Your PT and INR help your caregiver to adjust your dose of warfarin. The dose can change for many reasons. It is critically important that you take warfarin exactly as prescribed.  Many foods, especially foods high in vitamin K can interfere with warfarin and affect the PT and INR. Foods high in vitamin K include spinach, kale, broccoli, cabbage, collard and turnip greens, brussels sprouts, peas, cauliflower, seaweed, and parsley as well as beef and pork liver, green tea, and soybean oil. You should eat a consistent amount of foods high in vitamin K. Avoid major changes in your diet, or notify your caregiver before changing your diet. Arrange a visit with a dietitian to answer your questions.  Many medicines can interfere with warfarin and affect the PT and INR. You must tell your caregiver about any and all medicines you take, this includes all vitamins and supplements. Be  especially cautious with aspirin and anti-inflammatory medicines. Do not take or discontinue any prescribed or over-the-counter medicine except on the advice of your caregiver or pharmacist.  Warfarin can have side effects, such as excessive bruising or bleeding. You will need to hold pressure over cuts for longer than usual. Your caregiver or pharmacist will discuss other potential side effects.  Avoid sports or activities that may cause injury or bleeding.  Be mindful when shaving, flossing your teeth, or handling sharp objects.  Alcohol can change the body's ability to handle warfarin. It is best to avoid alcoholic drinks or consume only very small amounts while taking warfarin. Notify your caregiver if you change your alcohol intake.  Notify your  dentist or other caregivers before procedures.  Eat a diet that includes 5 or more servings of fruits and vegetables each day. This may reduce the risk of stroke. Certain diets may be prescribed to address high blood pressure, high cholesterol, diabetes, or obesity.  A low-sodium, low-saturated fat, low-trans fat, low-cholesterol diet is recommended to manage high blood pressure.  A low-saturated fat, low-trans fat, low-cholesterol, and high-fiber diet may control cholesterol levels.  A controlled-carbohydrate, controlled-sugar diet is recommended to manage diabetes.  A reduced-calorie, low-sodium, low-saturated fat, low-trans fat, low-cholesterol diet is recommended to manage obesity.  Maintain a healthy weight.  Stay physically active. It is recommended that you get at least 30 minutes of activity on most or all days.  Do not smoke.  Limit alcohol use even if you are not taking warfarin. Moderate alcohol use is considered to be:  No more than 2 drinks each day for men.  No more than 1 drink each day for nonpregnant women.  Stop drug abuse.  Home safety. A safe home environment is important to reduce the risk of falls. Your caregiver  may arrange for specialists to evaluate your home. Having grab bars in the bedroom and bathroom is often important. Your caregiver may arrange for equipment to be used at home, such as raised toilets and a seat for the shower.  Follow all instructions for follow-up with your caregiver. This is very important. This includes any referrals and lab tests. Proper follow up can prevent a stroke or another TIA from occurring. SEEK MEDICAL CARE IF:  You have personality changes.  You have difficulty swallowing.  You are seeing double.  You have dizziness.  You have a fever.  You have skin breakdown. SEEK IMMEDIATE MEDICAL CARE IF:  Any of these symptoms may represent a serious problem that is an emergency. Do not wait to see if the symptoms will go away. Get medical help right away. Call your local emergency services (911 in [place] Do not drive yourself to the hospital.  You have sudden weakness or numbness of the face, arm, or leg, especially on one side of the body.  You have sudden trouble walking or difficulty moving arms or legs.  You have sudden confusion.  You have trouble speaking (aphasia) or understanding.  You have sudden trouble seeing in one or both eyes.  You have a loss of balance or coordination.  You have a sudden, severe headache with no known cause.  You have new chest pain or an irregular heartbeat.  You have a partial or total loss of consciousness. MAKE SURE YOU:   Understand these instructions.  Will watch your condition.  Will get help right away if you are not doing well or get worse. Document Released: 08/06/2005 Document Revised: 11/01/2013 Document Reviewed: 02/01/2014 [facility] Patient Information 2015 [place], [place]. This information is not intended to replace advice given to you by your health care provider. Make sure you discuss any questions you have with your health care provider.  Transient Ischemic Attack A transient ischemic attack (TIA) is  a "warning stroke" that causes stroke-like symptoms. Unlike a stroke, a TIA does not cause permanent damage to the brain. The symptoms of a TIA can happen very fast and do not last long. It is important to know the symptoms of a TIA and what to do. This can help prevent a major stroke or death. CAUSES   A TIA is caused by a temporary blockage in an artery in the brain or neck (  carotid artery). The blockage does not allow the brain to get the blood supply it needs and can cause different symptoms. The blockage can be caused by either:  A blood clot.  Fatty buildup (plaque) in a neck or brain artery. RISK FACTORS  High blood pressure (hypertension).  High cholesterol.  Diabetes mellitus.  Heart disease.  The build up of plaque in the blood vessels (peripheral artery disease or atherosclerosis).  The build up of plaque in the blood vessels providing blood and oxygen to the brain (carotid artery stenosis).  An abnormal heart rhythm (atrial fibrillation).  Obesity.  Smoking.  Taking oral contraceptives (especially in combination with smoking).  Physical inactivity.  A diet high in fats, salt (sodium), and calories.  Alcohol use.  Use of illegal drugs (especially cocaine and methamphetamine).  Being male.  Being African American.  Being over the age of 36.  Family history of stroke.  Previous history of blood clots, stroke, TIA, or heart attack.  Sickle cell disease. SYMPTOMS  TIA symptoms are the same as a stroke but are temporary. These symptoms usually develop suddenly, or may be newly present upon awakening from sleep:  Sudden weakness or numbness of the face, arm, or leg, especially on one side of the body.  Sudden trouble walking or difficulty moving arms or legs.  Sudden confusion.  Sudden personality changes.  Trouble speaking (aphasia) or understanding.  Difficulty swallowing.  Sudden trouble seeing in one or both eyes.  Double  vision.  Dizziness.  Loss of balance or coordination.  Sudden severe headache with no known cause.  Trouble reading or writing.  Loss of bowel or bladder control.  Loss of consciousness. DIAGNOSIS  Your caregiver may be able to determine the presence or absence of a TIA based on your symptoms, history, and physical exam. Computed tomography (CT scan) of the brain is usually performed to help identify a TIA. Other tests may be done to diagnose a TIA. These tests may include:  Electrocardiography.  Continuous heart monitoring.  Echocardiography.  Carotid ultrasonography.  Magnetic resonance imaging (MRI).  A scan of the brain circulation.  Blood tests. PREVENTION  The risk of a TIA can be decreased by appropriately treating high blood pressure, high cholesterol, diabetes, heart disease, and obesity and by quitting smoking, limiting alcohol, and staying physically active. TREATMENT  Time is of the essence. Since the symptoms of TIA are the same as a stroke, it is important to seek treatment as soon as possible because you may need a medicine to dissolve the clot (thrombolytic) that cannot be given if too much time has passed. Treatment options vary. Treatment options may include rest, oxygen, intravenous (IV) fluids, and medicines to thin the blood (anticoagulants). Medicines and diet may be used to address diabetes, high blood pressure, and other risk factors. Measures will be taken to prevent short-term and long-term complications, including infection from breathing foreign material into the lungs (aspiration pneumonia), blood clots in the legs, and falls. Treatment options include procedures to either remove plaque in the carotid arteries or dilate carotid arteries that have narrowed due to plaque. Those procedures are:  Carotid endarterectomy.  Carotid angioplasty and stenting. HOME CARE INSTRUCTIONS   Take all medicines prescribed by your caregiver. Follow the directions  carefully. Medicines may be used to control risk factors for a stroke. Be sure you understand all your medicine instructions.  You may be told to take aspirin or the anticoagulant warfarin. Warfarin needs to be taken exactly as instructed.  Taking too much or too little warfarin is dangerous. Too much warfarin increases the risk of bleeding. Too little warfarin continues to allow the risk for blood clots. While taking warfarin, you will need to have regular blood tests to measure your blood clotting time. A PT blood test measures how long it takes for blood to clot. Your PT is used to calculate another value called an INR. Your PT and INR help your caregiver to adjust your dose of warfarin. The dose can change for many reasons. It is critically important that you take warfarin exactly as prescribed.  Many foods, especially foods high in vitamin K can interfere with warfarin and affect the PT and INR. Foods high in vitamin K include spinach, kale, broccoli, cabbage, collard and turnip greens, brussels sprouts, peas, cauliflower, seaweed, and parsley as well as beef and pork liver, green tea, and soybean oil. You should eat a consistent amount of foods high in vitamin K. Avoid major changes in your diet, or notify your caregiver before changing your diet. Arrange a visit with a dietitian to answer your questions.  Many medicines can interfere with warfarin and affect the PT and INR. You must tell your caregiver about any and all medicines you take, this includes all vitamins and supplements. Be especially cautious with aspirin and anti-inflammatory medicines. Do not take or discontinue any prescribed or over-the-counter medicine except on the advice of your caregiver or pharmacist.  Warfarin can have side effects, such as excessive bruising or bleeding. You will need to hold pressure over cuts for longer than usual. Your caregiver or pharmacist will discuss other potential side effects.  Avoid sports or  activities that may cause injury or bleeding.  Be mindful when shaving, flossing your teeth, or handling sharp objects.  Alcohol can change the body's ability to handle warfarin. It is best to avoid alcoholic drinks or consume only very small amounts while taking warfarin. Notify your caregiver if you change your alcohol intake.  Notify your dentist or other caregivers before procedures.  Eat a diet that includes 5 or more servings of fruits and vegetables each day. This may reduce the risk of stroke. Certain diets may be prescribed to address high blood pressure, high cholesterol, diabetes, or obesity.  A low-sodium, low-saturated fat, low-trans fat, low-cholesterol diet is recommended to manage high blood pressure.  A low-saturated fat, low-trans fat, low-cholesterol, and high-fiber diet may control cholesterol levels.  A controlled-carbohydrate, controlled-sugar diet is recommended to manage diabetes.  A reduced-calorie, low-sodium, low-saturated fat, low-trans fat, low-cholesterol diet is recommended to manage obesity.  Maintain a healthy weight.  Stay physically active. It is recommended that you get at least 30 minutes of activity on most or all days.  Do not smoke.  Limit alcohol use even if you are not taking warfarin. Moderate alcohol use is considered to be:  No more than 2 drinks each day for men.  No more than 1 drink each day for nonpregnant women.  Stop drug abuse.  Home safety. A safe home environment is important to reduce the risk of falls. Your caregiver may arrange for specialists to evaluate your home. Having grab bars in the bedroom and bathroom is often important. Your caregiver may arrange for equipment to be used at home, such as raised toilets and a seat for the shower.  Follow all instructions for follow-up with your caregiver. This is very important. This includes any referrals and lab tests. Proper follow up can prevent a stroke or another  TIA from  occurring. SEEK MEDICAL CARE IF:  You have personality changes.  You have difficulty swallowing.  You are seeing double.  You have dizziness.  You have a fever.  You have skin breakdown. SEEK IMMEDIATE MEDICAL CARE IF:  Any of these symptoms may represent a serious problem that is an emergency. Do not wait to see if the symptoms will go away. Get medical help right away. Call your local emergency services (911 in [place] Do not drive yourself to the hospital.  You have sudden weakness or numbness of the face, arm, or leg, especially on one side of the body.  You have sudden trouble walking or difficulty moving arms or legs.  You have sudden confusion.  You have trouble speaking (aphasia) or understanding.  You have sudden trouble seeing in one or both eyes.  You have a loss of balance or coordination.  You have a sudden, severe headache with no known cause.  You have new chest pain or an irregular heartbeat.  You have a partial or total loss of consciousness. MAKE SURE YOU:   Understand these instructions.  Will watch your condition.  Will get help right away if you are not doing well or get worse. Document Released: 08/06/2005 Document Revised: 11/01/2013 Document Reviewed: 02/01/2014 [facility] Patient Information 2015 [place], [place]. This information is not intended to replace advice given to you by your health care provider. Make sure you discuss any questions you have with your health care provider.  Transient Ischemic Attack A transient ischemic attack (TIA) is a "warning stroke" that causes stroke-like symptoms. Unlike a stroke, a TIA does not cause permanent damage to the brain. The symptoms of a TIA can happen very fast and do not last long. It is important to know the symptoms of a TIA and what to do. This can help prevent a major stroke or death. CAUSES   A TIA is caused by a temporary blockage in an artery in the brain or neck (carotid artery). The blockage  does not allow the brain to get the blood supply it needs and can cause different symptoms. The blockage can be caused by either:  A blood clot.  Fatty buildup (plaque) in a neck or brain artery. RISK FACTORS  High blood pressure (hypertension).  High cholesterol.  Diabetes mellitus.  Heart disease.  The build up of plaque in the blood vessels (peripheral artery disease or atherosclerosis).  The build up of plaque in the blood vessels providing blood and oxygen to the brain (carotid artery stenosis).  An abnormal heart rhythm (atrial fibrillation).  Obesity.  Smoking.  Taking oral contraceptives (especially in combination with smoking).  Physical inactivity.  A diet high in fats, salt (sodium), and calories.  Alcohol use.  Use of illegal drugs (especially cocaine and methamphetamine).  Being male.  Being African American.  Being over the age of 21.  Family history of stroke.  Previous history of blood clots, stroke, TIA, or heart attack.  Sickle cell disease. SYMPTOMS  TIA symptoms are the same as a stroke but are temporary. These symptoms usually develop suddenly, or may be newly present upon awakening from sleep:  Sudden weakness or numbness of the face, arm, or leg, especially on one side of the body.  Sudden trouble walking or difficulty moving arms or legs.  Sudden confusion.  Sudden personality changes.  Trouble speaking (aphasia) or understanding.  Difficulty swallowing.  Sudden trouble seeing in one or both eyes.  Double vision.  Dizziness.  Loss of balance or coordination.  Sudden severe headache with no known cause.  Trouble reading or writing.  Loss of bowel or bladder control.  Loss of consciousness. DIAGNOSIS  Your caregiver may be able to determine the presence or absence of a TIA based on your symptoms, history, and physical exam. Computed tomography (CT scan) of the brain is usually performed to help identify a TIA. Other  tests may be done to diagnose a TIA. These tests may include:  Electrocardiography.  Continuous heart monitoring.  Echocardiography.  Carotid ultrasonography.  Magnetic resonance imaging (MRI).  A scan of the brain circulation.  Blood tests. PREVENTION  The risk of a TIA can be decreased by appropriately treating high blood pressure, high cholesterol, diabetes, heart disease, and obesity and by quitting smoking, limiting alcohol, and staying physically active. TREATMENT  Time is of the essence. Since the symptoms of TIA are the same as a stroke, it is important to seek treatment as soon as possible because you may need a medicine to dissolve the clot (thrombolytic) that cannot be given if too much time has passed. Treatment options vary. Treatment options may include rest, oxygen, intravenous (IV) fluids, and medicines to thin the blood (anticoagulants). Medicines and diet may be used to address diabetes, high blood pressure, and other risk factors. Measures will be taken to prevent short-term and long-term complications, including infection from breathing foreign material into the lungs (aspiration pneumonia), blood clots in the legs, and falls. Treatment options include procedures to either remove plaque in the carotid arteries or dilate carotid arteries that have narrowed due to plaque. Those procedures are:  Carotid endarterectomy.  Carotid angioplasty and stenting. HOME CARE INSTRUCTIONS   Take all medicines prescribed by your caregiver. Follow the directions carefully. Medicines may be used to control risk factors for a stroke. Be sure you understand all your medicine instructions.  You may be told to take aspirin or the anticoagulant warfarin. Warfarin needs to be taken exactly as instructed.  Taking too much or too little warfarin is dangerous. Too much warfarin increases the risk of bleeding. Too little warfarin continues to allow the risk for blood clots. While taking warfarin,  you will need to have regular blood tests to measure your blood clotting time. A PT blood test measures how long it takes for blood to clot. Your PT is used to calculate another value called an INR. Your PT and INR help your caregiver to adjust your dose of warfarin. The dose can change for many reasons. It is critically important that you take warfarin exactly as prescribed.  Many foods, especially foods high in vitamin K can interfere with warfarin and affect the PT and INR. Foods high in vitamin K include spinach, kale, broccoli, cabbage, collard and turnip greens, brussels sprouts, peas, cauliflower, seaweed, and parsley as well as beef and pork liver, green tea, and soybean oil. You should eat a consistent amount of foods high in vitamin K. Avoid major changes in your diet, or notify your caregiver before changing your diet. Arrange a visit with a dietitian to answer your questions.  Many medicines can interfere with warfarin and affect the PT and INR. You must tell your caregiver about any and all medicines you take, this includes all vitamins and supplements. Be especially cautious with aspirin and anti-inflammatory medicines. Do not take or discontinue any prescribed or over-the-counter medicine except on the advice of your caregiver or pharmacist.  Warfarin can have side effects, such as excessive bruising or  bleeding. You will need to hold pressure over cuts for longer than usual. Your caregiver or pharmacist will discuss other potential side effects.  Avoid sports or activities that may cause injury or bleeding.  Be mindful when shaving, flossing your teeth, or handling sharp objects.  Alcohol can change the body's ability to handle warfarin. It is best to avoid alcoholic drinks or consume only very small amounts while taking warfarin. Notify your caregiver if you change your alcohol intake.  Notify your dentist or other caregivers before procedures.  Eat a diet that includes 5 or more  servings of fruits and vegetables each day. This may reduce the risk of stroke. Certain diets may be prescribed to address high blood pressure, high cholesterol, diabetes, or obesity.  A low-sodium, low-saturated fat, low-trans fat, low-cholesterol diet is recommended to manage high blood pressure.  A low-saturated fat, low-trans fat, low-cholesterol, and high-fiber diet may control cholesterol levels.  A controlled-carbohydrate, controlled-sugar diet is recommended to manage diabetes.  A reduced-calorie, low-sodium, low-saturated fat, low-trans fat, low-cholesterol diet is recommended to manage obesity.  Maintain a healthy weight.  Stay physically active. It is recommended that you get at least 30 minutes of activity on most or all days.  Do not smoke.  Limit alcohol use even if you are not taking warfarin. Moderate alcohol use is considered to be:  No more than 2 drinks each day for men.  No more than 1 drink each day for nonpregnant women.  Stop drug abuse.  Home safety. A safe home environment is important to reduce the risk of falls. Your caregiver may arrange for specialists to evaluate your home. Having grab bars in the bedroom and bathroom is often important. Your caregiver may arrange for equipment to be used at home, such as raised toilets and a seat for the shower.  Follow all instructions for follow-up with your caregiver. This is very important. This includes any referrals and lab tests. Proper follow up can prevent a stroke or another TIA from occurring. SEEK MEDICAL CARE IF:  You have personality changes.  You have difficulty swallowing.  You are seeing double.  You have dizziness.  You have a fever.  You have skin breakdown. SEEK IMMEDIATE MEDICAL CARE IF:  Any of these symptoms may represent a serious problem that is an emergency. Do not wait to see if the symptoms will go away. Get medical help right away. Call your local emergency services (911 in [place] Do  not drive yourself to the hospital.  You have sudden weakness or numbness of the face, arm, or leg, especially on one side of the body.  You have sudden trouble walking or difficulty moving arms or legs.  You have sudden confusion.  You have trouble speaking (aphasia) or understanding.  You have sudden trouble seeing in one or both eyes.  You have a loss of balance or coordination.  You have a sudden, severe headache with no known cause.  You have new chest pain or an irregular heartbeat.  You have a partial or total loss of consciousness. MAKE SURE YOU:   Understand these instructions.  Will watch your condition.  Will get help right away if you are not doing well or get worse. Document Released: 08/06/2005 Document Revised: 11/01/2013 Document Reviewed: 02/01/2014 [facility] Patient Information 2015 [place], [place]. This information is not intended to replace advice given to you by your health care provider. Make sure you discuss any questions you have with your health care provider. If you  wish to quit smoking, help is available.  For free tobacco cessation program offerings call the [place] at [phone] or [facility] at [phone]. You may also visit [email or web address] for more information and other programs.  Smoking Cessation Quitting smoking is important to your health and has many advantages. However, it is not always easy to quit since nicotine is a very addictive drug. Oftentimes, people try 3 times or more before being able to quit. This document explains the best ways for you to prepare to quit smoking. Quitting takes hard work and a lot of effort, but you can do it. ADVANTAGES OF QUITTING SMOKING  You will live longer, feel better, and live better.  Your body will feel the impact of quitting smoking almost immediately.  Within 20 minutes, blood pressure decreases. Your pulse returns to its normal level.  After 8 hours, carbon  monoxide levels in the blood return to normal. Your oxygen level increases.  After 24 hours, the chance of having a heart attack starts to decrease. Your breath, hair, and body stop smelling like smoke.  After 48 hours, damaged nerve endings begin to recover. Your sense of taste and smell improve.  After 72 hours, the body is virtually free of nicotine. Your bronchial tubes relax and breathing becomes easier.  After 2 to 12 weeks, lungs can hold more air. Exercise becomes easier and circulation improves.  The risk of having a heart attack, stroke, cancer, or lung disease is greatly reduced.  After 1 year, the risk of coronary heart disease is cut in half.  After 5 years, the risk of stroke falls to the same as a nonsmoker.  After 10 years, the risk of lung cancer is cut in half and the risk of other cancers decreases significantly.  After 15 years, the risk of coronary heart disease drops, usually to the level of a nonsmoker.  If you are pregnant, quitting smoking will improve your chances of having a healthy baby.  The people you live with, especially any children, will be healthier.  You will have extra money to spend on things other than cigarettes. QUESTIONS TO THINK ABOUT BEFORE ATTEMPTING TO QUIT You may want to talk about your answers with your health care provider.  Why do you want to quit?  If you tried to quit in the past, what helped and what did not?  What will be the most difficult situations for you after you quit? How will you plan to handle them?  Who can help you through the tough times? Your family? Friends? A health care provider?  What pleasures do you get from smoking? What ways can you still get pleasure if you quit? Here are some questions to ask your health care provider:  How can you help me to be successful at quitting?  What medicine do you think would be best for me and how should I take it?  What should I do if I need more help?  What is  smoking withdrawal like? How can I get information on withdrawal? GET READY  Set a quit date.  Change your environment by getting rid of all cigarettes, ashtrays, matches, and lighters in your home, car, or work. Do not let people smoke in your home.  Review your past attempts to quit. Think about what worked and what did not. GET SUPPORT AND ENCOURAGEMENT You have a better chance of being successful if you have help. You can get support in many ways.  Tell your family, friends, and coworkers that you are going to quit and need their support. Ask them not to smoke around you.  Get individual, group, or telephone counseling and support. Programs are available at [employer] and health centers. Call your local health department for information about programs in your area.  Spiritual beliefs and practices may help some smokers quit.  Download a "quit meter" on your computer to keep track of quit statistics, such as how long you have gone without smoking, cigarettes not smoked, and money saved.  Get a self-help book about quitting smoking and staying off tobacco. Bristol yourself from urges to smoke. Talk to someone, go for a walk, or occupy your time with a task.  Change your normal routine. Take a different route to work. Drink tea instead of coffee. Eat breakfast in a different place.  Reduce your stress. Take a hot bath, exercise, or read a book.  Plan something enjoyable to do every day. Reward yourself for not smoking.  Explore interactive web-based programs that specialize in helping you quit. GET MEDICINE AND USE IT CORRECTLY Medicines can help you stop smoking and decrease the urge to smoke. Combining medicine with the above behavioral methods and support can greatly increase your chances of successfully quitting smoking.  Nicotine replacement therapy helps deliver nicotine to your body without the negative effects and risks of smoking.  Nicotine replacement therapy includes nicotine gum, lozenges, inhalers, nasal sprays, and skin patches. Some may be available over-the-counter and others require a prescription.  Antidepressant medicine helps people abstain from smoking, but how this works is unknown. This medicine is available by prescription.  Nicotinic receptor partial agonist medicine simulates the effect of nicotine in your brain. This medicine is available by prescription. Ask your health care provider for advice about which medicines to use and how to use them based on your health history. Your health care provider will tell you what side effects to look out for if you choose to be on a medicine or therapy. Carefully read the information on the package. Do not use any other product containing nicotine while using a nicotine replacement product.  RELAPSE OR DIFFICULT SITUATIONS Most relapses occur within the first 3 months after quitting. Do not be discouraged if you start smoking again. Remember, most people try several times before finally quitting. You may have symptoms of withdrawal because your body is used to nicotine. You may crave cigarettes, be irritable, feel very hungry, cough often, get headaches, or have difficulty concentrating. The withdrawal symptoms are only temporary. They are strongest when you first quit, but they will go away within 10-14 days. To reduce the chances of relapse, try to:  Avoid drinking alcohol. Drinking lowers your chances of successfully quitting.  Reduce the amount of caffeine you consume. Once you quit smoking, the amount of caffeine in your body increases and can give you symptoms, such as a rapid heartbeat, sweating, and anxiety.  Avoid smokers because they can make you want to smoke.  Do not let weight gain distract you. Many smokers will gain weight when they quit, usually less than 10 pounds. Eat a healthy diet and stay active. You can always lose the weight gained after you  quit.  Find ways to improve your mood other than smoking. FOR MORE INFORMATION  www.smokefree.gov  Document Released: 10/21/2001 Document Revised: 03/13/2014 Document Reviewed: 02/05/2012 [facility] Patient Information 2015 [place], [place]. This information is not intended to replace advice given  to you by your health care provider. Make sure you discuss any questions you have with your health care provider.

## 2015-02-24 NOTE — Progress Notes (Signed)
STROKE TEAM PROGRESS NOTE   HISTORY Phillip Richardson is a 69 y.o. male with a past medical history significant for smoking, admitted to [facility] after sustaining a transient episode of left sided paresthesias. Patient said that he never had similar symptoms before. Stated that he was driving when suddenly noticed a numb-tingly sensation of the fingers left hand and almost simultaneously the same sensation developed in his entire left arm-leg-face. Said that " it was like when you got a novocain shot at the dentist office". The abnormal sensation lasted for 10 or 15 minutes but came back again1 hour later and lasted again for 15-20 minutes. Last episode was at [facility] with tingling in fingers. Denies associated HA, vertigo, double vision, difficulty swallowing, focal weakness, slurred speech, language or vision impairment. Said that as soon as he got home he took 2 aspirin. CT brain was personally reviewed and showed no acute abnormality. CBC, metabolic panel, UDS,and ETOH level were unrevealing.  Date last known well: 02/23/15 Time last known well: unclear tPA Given: no, symptoms resolved   SUBJECTIVE (INTERVAL HISTORY) No family members present. The patient states that his numbness has resolved and he is back to baseline. He reports having had several episodes transient numbness lasting up to tend to 15 minutes. Smoking cessation was strongly advised. Other risk factors were also discussed including statin therapy for elevated LDL and ongoing aspirin therapy.   OBJECTIVE Temp:  [97.7 F (36.5 C)-98.8 F (37.1 C)] 98.2 F (36.8 C) (04/16 0940) Pulse Rate:  [56-72] 57 (04/16 0940) Cardiac Rhythm:  [-] Normal sinus rhythm;Bundle branch block (04/15 2000) Resp:  [12-20] 16 (04/16 0940) BP: (111-180)/(60-99) 133/79 mmHg (04/16 0940) SpO2:  [94 %-100 %] 98 % (04/16 0940) Weight:  [71.215 kg (157 lb)] 71.215 kg (157 lb) (04/15 1806)   Recent Labs Lab 02/23/15 2158 02/24/15 0644  GLUCAP 145* 83     Recent Labs Lab 02/23/15 1534  NA 138  K 3.8  CL 104  CO2 24  GLUCOSE 82  BUN 20  CREATININE 0.81  CALCIUM 9.3    Recent Labs Lab 02/23/15 1534  AST 26  ALT 19  ALKPHOS 87  BILITOT 0.9  PROT 6.6  ALBUMIN 3.8    Recent Labs Lab 02/23/15 1534 02/24/15 0635  WBC 9.3 8.7  NEUTROABS 6.1  --   HGB 15.6 15.9  HCT 45.0 47.2  MCV 92.0 93.7  PLT 138* 154    Recent Labs Lab 02/23/15 1534  TROPONINI <0.03    Recent Labs  02/23/15 1534  LABPROT 14.8  INR 1.15    Recent Labs  02/23/15 1625  COLORURINE YELLOW  LABSPEC 1.010  PHURINE 7.0  GLUCOSEU NEGATIVE  HGBUR NEGATIVE  BILIRUBINUR NEGATIVE  KETONESUR 15*  PROTEINUR NEGATIVE  UROBILINOGEN 0.2  NITRITE NEGATIVE  LEUKOCYTESUR NEGATIVE       Component Value Date/Time   CHOL 199 02/24/2015 0635   TRIG 69 02/24/2015 0635   HDL 38* 02/24/2015 0635   CHOLHDL 5.2 02/24/2015 0635   VLDL 14 02/24/2015 0635   LDLCALC 147* 02/24/2015 0635   No results found for: HGBA1C    Component Value Date/Time   LABOPIA NONE DETECTED 02/23/2015 1625   COCAINSCRNUR NONE DETECTED 02/23/2015 1625   LABBENZ NONE DETECTED 02/23/2015 1625   AMPHETMU NONE DETECTED 02/23/2015 1625   THCU NONE DETECTED 02/23/2015 1625   LABBARB NONE DETECTED 02/23/2015 1625     Recent Labs Lab 02/23/15 1534  ETH <5    Ct Head Wo  Contrast 02/23/2015    Normal head CT.    Mri Brain Without Contrast 02/23/2015    1. No acute intracranial abnormality. 2. Mild for age signal changes in the brain suggestive of chronic small vessel disease.  3. Negative intracranial MRA.        PHYSICAL EXAM Pleasant middle-aged Caucasian male currently not in distress. . Afebrile. Head is nontraumatic. Neck is supple without bruit.    Cardiac exam no murmur or gallop. Lungs are clear to auscultation. Distal pulses are well felt.  Neurological Exam ;  Awake  Alert oriented x 3. Normal speech and language.eye movements full without  nystagmus.fundi were not visualized. Vision acuity and fields appear normal. Hearing is normal. Palatal movements are normal. Face symmetric. Tongue midline. Normal strength, tone, reflexes and coordination. Normal sensation. Gait deferred.    ASSESSMENT/PLAN Mr. Phillip Richardson is a 69 y.o. male with history of tobacco use presenting with transient left-sided paresthesias. He did not receive IV t-PA due to resolution of deficits.   TIA:  Non-dominant   Resultant  resolution of deficits  MRI  negative  MRA  negative  Carotid Doppler  Preliminary report: 1-39% ICA stenosisl. Vertebral artery flow is antegrade.   2D Echo  pending  LDL 147  HgbA1c pending  HIV antibody - pending  Subcutaneous heparin for VTE prophylaxis Diet Heart Room service appropriate?: Yes; Fluid consistency:: Thin  no antithrombotic prior to admission, now on aspirin 81 mg orally every day  Patient counseled to be compliant with his antithrombotic medications  Ongoing aggressive stroke risk factor management  Therapy recommendations: Pending  Disposition: Pending    Hyperlipidemia  Home meds: No lipid lowering medications prior to admission  LDL 147, goal < 70  Suggest adding Lipitor 40 mg daily  Continue statin at discharge    Other Stroke Risk Factors  Advanced age  Cigarette smoker, advised to stop smoking.    PLAN  Await therapy evaluations  Await final studies  Statin therapy  Antiplatelet therapy  Smoking cessation  Follow-up with Dr. Leonie Man in 2 months  Hospital day #   Mikey Bussing PA-C [facility] Pager [phone] 02/24/2015, 9:55 AM  I have personally examined this patient, reviewed notes, independently viewed imaging studies, participated in medical decision making and plan of care. I have made any additions or clarifications directly to the above note. Agree with note above.  He presented with transient left hemibody sensory loss due  to right brain TIA likely from small vessel disease. He remains at risk for recurrent strokes, TIAs and neurological worsening and needs ongoing stroke evaluation and aggressive risk factor control. Start aspirin for stroke prevention and add Lipitor for elevated lipids.  Antony Contras, MD Medical Director [facility] Pager: [phone] 02/24/2015 1:41 PM    To contact Stroke Continuity provider, please refer to [email or web address] After hours, contact General Neurology

## 2015-02-24 NOTE — Progress Notes (Signed)
OT Cancellation Note  Patient Details Name: Phillip Richardson MRN: [id number] DOB: 1945/12/12   Cancelled Treatment:    Reason Eval/Treat Not Completed: OT screened, no needs identified, will sign off  Su Ley, OTR/L Occupational Therapist [phone] (pager)  02/24/2015, 4:12 PM

## 2015-02-24 NOTE — Evaluation (Signed)
Physical Therapy Evaluation Patient Details Name: Phillip Richardson MRN: [id number] DOB: [birth year] Today's Date: 02/24/2015   History of Present Illness  admitted wiht TIA.  MRI/MRA negative.  Patient with h/o smoking  Clinical Impression  Patient with no further PT needs - independent for all mobility and no balance deficits noted.  Will sign off.    Follow Up Recommendations No PT follow up    Equipment Recommendations  None recommended by PT    Recommendations for Other Services       Precautions / Restrictions Precautions Precautions: None Restrictions Weight Bearing Restrictions: No      Mobility  Bed Mobility Overal bed mobility: Independent                Transfers Overall transfer level: Independent                  Ambulation/Gait Ambulation/Gait assistance: Independent Ambulation Distance (Feet): 150 Feet Assistive device: None Gait Pattern/deviations: WFL(Within Functional Limits)   Gait velocity interpretation: at or above normal speed for age/gender General Gait Details: assessed patient with varying speeds, quick stops, 360 turns, reaching over to floor - no loss of balance.  Stairs            Wheelchair Mobility    Modified Rankin (Stroke Patients Only) Modified Rankin (Stroke Patients Only) Pre-Morbid Rankin Score: No symptoms Modified Rankin: No symptoms     Balance Overall balance assessment: Independent                                           Pertinent Vitals/Pain Pain Assessment: No/denies pain    Home Living Family/patient expects to be discharged to:: Private residence Living Arrangements: Alone Available Help at Discharge: Family Type of Home: House       Home Layout: Two level;Able to live on main level with bedroom/bathroom Home Equipment: None      Prior Function Level of Independence: Independent               Hand Dominance        Extremity/Trunk Assessment   Upper Extremity Assessment: Overall WFL for tasks assessed           Lower Extremity Assessment: Overall WFL for tasks assessed      Cervical / Trunk Assessment: Normal  Communication   Communication: No difficulties  Cognition Arousal/Alertness: Awake/alert Behavior During Therapy: WFL for tasks assessed/performed Overall Cognitive Status: Within Functional Limits for tasks assessed                      General Comments      Exercises        Assessment/Plan    PT Assessment Patent does not need any further PT services  PT Diagnosis Generalized weakness   PT Problem List    PT Treatment Interventions     PT Goals (Current goals can be found in the Care Plan section) Acute Rehab PT Goals PT Goal Formulation: All assessment and education complete, DC therapy    Frequency     Barriers to discharge        Co-evaluation               End of Session   Activity Tolerance: Patient tolerated treatment well Patient left: in bed;with call bell/phone within reach      Functional Assessment Tool Used: clinical  judgement Functional Limitation: Mobility: Walking and moving around Mobility: Walking and Moving Around Current Status [phone]): 0 percent impaired, limited or restricted Mobility: Walking and Moving Around Goal Status [phone]): 0 percent impaired, limited or restricted Mobility: Walking and Moving Around Discharge Status [phone]): 0 percent impaired, limited or restricted    Time: 5188-4166 PT Time Calculation (min) (ACUTE ONLY): 8 min   Charges:   PT Evaluation $Initial PT Evaluation Tier I: 1 Procedure     PT G Codes:   PT G-Codes **NOT FOR INPATIENT CLASS** Functional Assessment Tool Used: clinical judgement Functional Limitation: Mobility: Walking and moving around Mobility: Walking and Moving Around Current Status (A6301): 0 percent impaired, limited or restricted Mobility: Walking and Moving Around Goal Status (S0109): 0 percent  impaired, limited or restricted Mobility: Walking and Moving Around Discharge Status [phone]): 0 percent impaired, limited or restricted    Shanna Cisco 02/24/2015, 2:49 PM  02/24/2015 [place], PT [phone]

## 2015-02-24 NOTE — Progress Notes (Signed)
  Echocardiogram 2D Echocardiogram has been performed.  Phillip Richardson 02/24/2015, 2:29 PM

## 2015-02-24 NOTE — Progress Notes (Signed)
Subjective: He reports he is doing well this morning. He has some numbness in his R thumb that improves with movement and he attributes to lying in bed. Tolerating PO intake. Denies CP or SOB.  Objective: Vital signs in last 24 hours: Filed Vitals:   02/24/15 0000 02/24/15 0200 02/24/15 0400 02/24/15 0554  BP: 131/71 136/82 151/81 134/79  Pulse: 59 56 60 63  Temp: 98.1 F (36.7 C) 97.9 F (36.6 C) 98.1 F (36.7 C) 97.9 F (36.6 C)  TempSrc: Oral Oral Oral Oral  Resp: 14 16 14 18   Height:      Weight:      SpO2: 97% 96% 98% 97%   Weight change:  No intake or output data in the 24 hours ending 02/24/15 0709 General Apperance: NAD HEENT: Normocephalic, atraumatic, PERRL, EOMI, anicteric sclera Neck: Supple, trachea midline Lungs: Clear to auscultation bilaterally. No wheezes, rhonchi or rales. Breathing comfortably on RA Heart: Regular rate and rhythm, no murmur/rub/gallop Abdomen: Soft, nontender, nondistended, no rebound/guarding Extremities: Normal, atraumatic, warm and well perfused, no edema Pulses: 2+ throughout Skin: No rashes or lesions Neurologic: Alert and oriented x 3. Speech fluent without aphasia. CNII-XII intact. Normal strength and sensation bilaterally. DTR 1+ and symmetric.  Lab Results: Basic Metabolic Panel:  Recent Labs Lab 02/23/15 1534  NA 138  K 3.8  CL 104  CO2 24  GLUCOSE 82  BUN 20  CREATININE 0.81  CALCIUM 9.3   Liver Function Tests:  Recent Labs Lab 02/23/15 1534  AST 26  ALT 19  ALKPHOS 87  BILITOT 0.9  PROT 6.6  ALBUMIN 3.8   CBC:  Recent Labs Lab 02/23/15 1534  WBC 9.3  NEUTROABS 6.1  HGB 15.6  HCT 45.0  MCV 92.0  PLT 138*   Cardiac Enzymes:  Recent Labs Lab 02/23/15 1534  TROPONINI <0.03   CBG:  Recent Labs Lab 02/23/15 2158 02/24/15 0644  GLUCAP 145* 83   Hemoglobin A1C: No results for input(s): HGBA1C in the last 168 hours. Fasting Lipid Panel:  Recent Labs Lab 02/24/15 0635  CHOL 199    HDL 38*  LDLCALC 147*  TRIG 69  CHOLHDL 5.2   Coagulation:  Recent Labs Lab 02/23/15 1534  LABPROT 14.8  INR 1.15   Urine Drug Screen: Drugs of Abuse     Component Value Date/Time   LABOPIA NONE DETECTED 02/23/2015 1625   COCAINSCRNUR NONE DETECTED 02/23/2015 1625   LABBENZ NONE DETECTED 02/23/2015 1625   AMPHETMU NONE DETECTED 02/23/2015 1625   THCU NONE DETECTED 02/23/2015 1625   LABBARB NONE DETECTED 02/23/2015 1625    Alcohol Level:  Recent Labs Lab 02/23/15 1534  ETH <5   Urinalysis:  Recent Labs Lab 02/23/15 1625  COLORURINE YELLOW  LABSPEC 1.010  PHURINE 7.0  GLUCOSEU NEGATIVE  HGBUR NEGATIVE  BILIRUBINUR NEGATIVE  KETONESUR 15*  PROTEINUR NEGATIVE  UROBILINOGEN 0.2  NITRITE NEGATIVE  LEUKOCYTESUR NEGATIVE   Studies/Results: Ct Head Wo Contrast  02/23/2015   CLINICAL DATA:  Left-sided numbness/tingling  EXAM: CT HEAD WITHOUT CONTRAST  TECHNIQUE: Contiguous axial images were obtained from the base of the skull through the vertex without intravenous contrast.  COMPARISON:  None.  FINDINGS: No evidence of parenchymal hemorrhage or extra-axial fluid collection. No mass lesion, mass effect, or midline shift.  No CT evidence of acute infarction.  Cerebral volume is within normal limits.  No ventriculomegaly.  The visualized paranasal sinuses are essentially clear. Partial opacification of the right mastoid air cells.  No evidence  of calvarial fracture.  IMPRESSION: Normal head CT.   Electronically Signed   By: Julian Hy M.D.   On: 02/23/2015 15:54   Mri Brain Without Contrast  02/23/2015   CLINICAL DATA:  69 year old male with left side numbness and tingling. Symptoms since noon. TIA. Initial encounter.  EXAM: MRI HEAD WITHOUT CONTRAST  MRA HEAD WITHOUT CONTRAST  TECHNIQUE: Multiplanar, multiecho pulse sequences of the brain and surrounding structures were obtained without intravenous contrast. Angiographic images of the head were obtained using MRA  technique without contrast.  COMPARISON:  Head CT without contrast 1552 hours today.  FINDINGS: MRI HEAD FINDINGS  Cerebral volume is within normal limits for age. No restricted diffusion to suggest acute infarction. No midline shift, mass effect, evidence of mass lesion, ventriculomegaly, extra-axial collection or acute intracranial hemorrhage. Cervicomedullary junction and pituitary are within normal limits. Negative visualized cervical spine. Major intracranial vascular flow voids are preserved, dominant appearing distal right vertebral artery.  Minimal for age cerebral white matter T2 and FLAIR hyperintensity, chiefly in the left corona radiata as seen on series 7, image 18 which most resembles a small chronic lacune. Elsewhere normal gray and white matter signal. No chronic blood products or cortical encephalomalacia identified.  Visible internal auditory structures appear normal. Mild bilateral mastoid effusions greater on the right. Negative visualized nasopharynx. Mild paranasal sinus mucosal thickening. Visualized orbit soft tissues are within normal limits. Visualized scalp soft tissues are within normal limits. Normal bone marrow signal.  MRA HEAD FINDINGS  Antegrade flow in the posterior circulation with dominant distal right vertebral artery. The non dominant left vertebral artery terminates in PICA. Dominant right AICA origin. No distal vertebral artery stenosis. No basilar artery stenosis. Fetal type bilateral PCA origins. Normal SCA origins. Bilateral PCA branches are within normal limits.  Antegrade flow in both ICA siphons. No siphon stenosis. Ophthalmic and posterior communicating artery origins are within normal limits. Normal carotid termini, MCA and ACA origins. Anterior communicating artery and visualized bilateral ACA branches are within normal limits. Visualized bilateral MCA branches are within normal limits.  IMPRESSION: 1.  No acute intracranial abnormality. 2. Mild for age signal changes  in the brain suggestive of chronic small vessel disease. 3.  Negative intracranial MRA.   Electronically Signed   By: Genevie Ann M.D.   On: 02/23/2015 19:27   Mr Jodene Nam Head/brain Wo Cm  02/23/2015   CLINICAL DATA:  69 year old male with left side numbness and tingling. Symptoms since noon. TIA. Initial encounter.  EXAM: MRI HEAD WITHOUT CONTRAST  MRA HEAD WITHOUT CONTRAST  TECHNIQUE: Multiplanar, multiecho pulse sequences of the brain and surrounding structures were obtained without intravenous contrast. Angiographic images of the head were obtained using MRA technique without contrast.  COMPARISON:  Head CT without contrast 1552 hours today.  FINDINGS: MRI HEAD FINDINGS  Cerebral volume is within normal limits for age. No restricted diffusion to suggest acute infarction. No midline shift, mass effect, evidence of mass lesion, ventriculomegaly, extra-axial collection or acute intracranial hemorrhage. Cervicomedullary junction and pituitary are within normal limits. Negative visualized cervical spine. Major intracranial vascular flow voids are preserved, dominant appearing distal right vertebral artery.  Minimal for age cerebral white matter T2 and FLAIR hyperintensity, chiefly in the left corona radiata as seen on series 7, image 18 which most resembles a small chronic lacune. Elsewhere normal gray and white matter signal. No chronic blood products or cortical encephalomalacia identified.  Visible internal auditory structures appear normal. Mild bilateral mastoid effusions greater on the right.  Negative visualized nasopharynx. Mild paranasal sinus mucosal thickening. Visualized orbit soft tissues are within normal limits. Visualized scalp soft tissues are within normal limits. Normal bone marrow signal.  MRA HEAD FINDINGS  Antegrade flow in the posterior circulation with dominant distal right vertebral artery. The non dominant left vertebral artery terminates in PICA. Dominant right AICA origin. No distal vertebral  artery stenosis. No basilar artery stenosis. Fetal type bilateral PCA origins. Normal SCA origins. Bilateral PCA branches are within normal limits.  Antegrade flow in both ICA siphons. No siphon stenosis. Ophthalmic and posterior communicating artery origins are within normal limits. Normal carotid termini, MCA and ACA origins. Anterior communicating artery and visualized bilateral ACA branches are within normal limits. Visualized bilateral MCA branches are within normal limits.  IMPRESSION: 1.  No acute intracranial abnormality. 2. Mild for age signal changes in the brain suggestive of chronic small vessel disease. 3.  Negative intracranial MRA.   Electronically Signed   By: Genevie Ann M.D.   On: 02/23/2015 19:27   Medications: I have reviewed the patient's current medications. Scheduled Meds: . aspirin  81 mg Oral Daily  . atorvastatin  40 mg Oral q1800  . heparin subcutaneous  5,000 Units Subcutaneous 3 times per day   Continuous Infusions:  PRN Meds:. Assessment/Plan: Principal Problem:   TIA (transient ischemic attack) Active Problems:   Tobacco use   Thrombocytopenia   Elevated serum glucose  TIA: He has a risk factor for stroke which is active smoking. CT head demonstrates no acute processes. EKG demonstrates no acute ischemic changes. Initial troponin negative. ABCD2 score 3 - low 2 day stroke risk (1 percent). MRI/MRA without acute changes. -Carotid dopplers pending -Echo pending -HgbA1c pending -PT/OT consulted -ASA 81mg  daily -Telemetry monitoring -Neurology following, appreciate recommendations  Hyperlipidemia: LDL 147 -Started on atorvastatin 40mg  daily  Hypertension: No previous diagnosis. BP 175/93 on admission.  -Allow permissive hypertension  FEN: Heart healthy DVT ppx: subq hep  Dispo: Disposition is deferred at this time, awaiting improvement of current medical problems.  Anticipated discharge in approximately 0-1 day(s).   The patient does not have a current  PCP (No primary care provider on file.) and does not need an [facility] hospital follow-up appointment after discharge.  The patient does not have transportation limitations that hinder transportation to clinic appointments.  .Services Needed at time of discharge: Y = Yes, Blank = No PT:   OT:   RN:   Equipment:   Other:       Milagros Loll, MD 02/24/2015, 7:09 AM

## 2015-02-26 LAB — HEMOGLOBIN A1C
Hgb A1c MFr Bld: 5.7 % — ABNORMAL HIGH (ref 4.8–5.6)
Mean Plasma Glucose: 117 mg/dL

## 2015-02-26 NOTE — Progress Notes (Signed)
Patient discharged prior to being seen by the CM; TCT the patient at home, he was discharged Saturday ( 02/24/2015) pt has Medicare and [employer]. Patient stated that he is in the process of looking for a physician at this timeAneta Mins [phone]

## 2015-02-27 NOTE — Discharge Summary (Signed)
Name: Phillip Richardson MRN: [id number] DOB: 1946/03/04 69 y.o. PCP: No primary care provider on file.  Date of Admission: 02/23/2015  2:49 PM Date of Discharge: 02/24/2015 Attending Physician: Madilyn Fireman, MD  Discharge Diagnosis: Principal Problem:   TIA (transient ischemic attack) Active Problems:   Tobacco use   Elevated serum glucose   Hyperlipidemia  Discharge Medications:   Medication List    TAKE these medications        aspirin 81 MG chewable tablet  Chew 1 tablet (81 mg total) by mouth daily.     atorvastatin 40 MG tablet  Commonly known as:  LIPITOR  Take 1 tablet (40 mg total) by mouth daily at 6 PM.     multivitamin with minerals Tabs tablet  Take 1 tablet by mouth daily.     omega-3 acid ethyl esters 1 G capsule  Commonly known as:  LOVAZA  Take 1 g by mouth daily.        Disposition and follow-up:   Phillip Richardson was discharged from [facility] in Stable condition.  At the hospital follow up visit please address:  1.  TIA: Assess for recurrence. He is to follow up with neurology in 8 weeks. Hypertension: BP was 175/93 on admission. Please assess for antihypertensive need.  2.  Labs / imaging needed at time of follow-up: None  3.  Pending labs/ test needing follow-up: None  Follow-up Appointments:     Follow-up Information    Follow up with SETHI,PRAMOD, MD In 2 months.   Specialties:  Neurology, Radiology   Why:  Please call to set up follow up appointment for TIA   Contact information:   [address] [address] [phone]       Follow up with Primary Care Provider In 2 weeks.   Why:  Please establish care with a primary care physician of your choice.      Discharge Instructions: Discharge Instructions    Call MD for:  difficulty breathing, headache or visual disturbances    Complete by:  As directed      Call MD for:  persistant dizziness or light-headedness    Complete by:  As  directed      Call MD for:  persistant nausea and vomiting    Complete by:  As directed      Call MD for:  severe uncontrolled pain    Complete by:  As directed      Call MD for:  temperature >100.4    Complete by:  As directed      Diet - low sodium heart healthy    Complete by:  As directed      Increase activity slowly    Complete by:  As directed            Consultations: Neurology  Procedures Performed:  Ct Head Wo Contrast  02/23/2015   CLINICAL DATA:  Left-sided numbness/tingling  EXAM: CT HEAD WITHOUT CONTRAST  TECHNIQUE: Contiguous axial images were obtained from the base of the skull through the vertex without intravenous contrast.  COMPARISON:  None.  FINDINGS: No evidence of parenchymal hemorrhage or extra-axial fluid collection. No mass lesion, mass effect, or midline shift.  No CT evidence of acute infarction.  Cerebral volume is within normal limits.  No ventriculomegaly.  The visualized paranasal sinuses are essentially clear. Partial opacification of the right mastoid air cells.  No evidence of calvarial fracture.  IMPRESSION: Normal head CT.  Electronically Signed   By: Julian Hy M.D.   On: 02/23/2015 15:54   Mri Brain Without Contrast  02/23/2015   CLINICAL DATA:  69 year old male with left side numbness and tingling. Symptoms since noon. TIA. Initial encounter.  EXAM: MRI HEAD WITHOUT CONTRAST  MRA HEAD WITHOUT CONTRAST  TECHNIQUE: Multiplanar, multiecho pulse sequences of the brain and surrounding structures were obtained without intravenous contrast. Angiographic images of the head were obtained using MRA technique without contrast.  COMPARISON:  Head CT without contrast 1552 hours today.  FINDINGS: MRI HEAD FINDINGS  Cerebral volume is within normal limits for age. No restricted diffusion to suggest acute infarction. No midline shift, mass effect, evidence of mass lesion, ventriculomegaly, extra-axial collection or acute intracranial hemorrhage. Cervicomedullary  junction and pituitary are within normal limits. Negative visualized cervical spine. Major intracranial vascular flow voids are preserved, dominant appearing distal right vertebral artery.  Minimal for age cerebral white matter T2 and FLAIR hyperintensity, chiefly in the left corona radiata as seen on series 7, image 18 which most resembles a small chronic lacune. Elsewhere normal gray and white matter signal. No chronic blood products or cortical encephalomalacia identified.  Visible internal auditory structures appear normal. Mild bilateral mastoid effusions greater on the right. Negative visualized nasopharynx. Mild paranasal sinus mucosal thickening. Visualized orbit soft tissues are within normal limits. Visualized scalp soft tissues are within normal limits. Normal bone marrow signal.  MRA HEAD FINDINGS  Antegrade flow in the posterior circulation with dominant distal right vertebral artery. The non dominant left vertebral artery terminates in PICA. Dominant right AICA origin. No distal vertebral artery stenosis. No basilar artery stenosis. Fetal type bilateral PCA origins. Normal SCA origins. Bilateral PCA branches are within normal limits.  Antegrade flow in both ICA siphons. No siphon stenosis. Ophthalmic and posterior communicating artery origins are within normal limits. Normal carotid termini, MCA and ACA origins. Anterior communicating artery and visualized bilateral ACA branches are within normal limits. Visualized bilateral MCA branches are within normal limits.  IMPRESSION: 1.  No acute intracranial abnormality. 2. Mild for age signal changes in the brain suggestive of chronic small vessel disease. 3.  Negative intracranial MRA.   Electronically Signed   By: Genevie Ann M.D.   On: 02/23/2015 19:27   Mr Phillip Richardson Head/brain Wo Cm  02/23/2015   CLINICAL DATA:  69 year old male with left side numbness and tingling. Symptoms since noon. TIA. Initial encounter.  EXAM: MRI HEAD WITHOUT CONTRAST  MRA HEAD WITHOUT  CONTRAST  TECHNIQUE: Multiplanar, multiecho pulse sequences of the brain and surrounding structures were obtained without intravenous contrast. Angiographic images of the head were obtained using MRA technique without contrast.  COMPARISON:  Head CT without contrast 1552 hours today.  FINDINGS: MRI HEAD FINDINGS  Cerebral volume is within normal limits for age. No restricted diffusion to suggest acute infarction. No midline shift, mass effect, evidence of mass lesion, ventriculomegaly, extra-axial collection or acute intracranial hemorrhage. Cervicomedullary junction and pituitary are within normal limits. Negative visualized cervical spine. Major intracranial vascular flow voids are preserved, dominant appearing distal right vertebral artery.  Minimal for age cerebral white matter T2 and FLAIR hyperintensity, chiefly in the left corona radiata as seen on series 7, image 18 which most resembles a small chronic lacune. Elsewhere normal gray and white matter signal. No chronic blood products or cortical encephalomalacia identified.  Visible internal auditory structures appear normal. Mild bilateral mastoid effusions greater on the right. Negative visualized nasopharynx. Mild paranasal sinus mucosal thickening. Visualized orbit  soft tissues are within normal limits. Visualized scalp soft tissues are within normal limits. Normal bone marrow signal.  MRA HEAD FINDINGS  Antegrade flow in the posterior circulation with dominant distal right vertebral artery. The non dominant left vertebral artery terminates in PICA. Dominant right AICA origin. No distal vertebral artery stenosis. No basilar artery stenosis. Fetal type bilateral PCA origins. Normal SCA origins. Bilateral PCA branches are within normal limits.  Antegrade flow in both ICA siphons. No siphon stenosis. Ophthalmic and posterior communicating artery origins are within normal limits. Normal carotid termini, MCA and ACA origins. Anterior communicating artery and  visualized bilateral ACA branches are within normal limits. Visualized bilateral MCA branches are within normal limits.  IMPRESSION: 1.  No acute intracranial abnormality. 2. Mild for age signal changes in the brain suggestive of chronic small vessel disease. 3.  Negative intracranial MRA.   Electronically Signed   By: Genevie Ann M.D.   On: 02/23/2015 19:27    2D Echo:  Study Conclusions  - Left ventricle: The cavity size was normal. Wall thickness was normal. Systolic function was normal. The estimated ejection fraction was in the range of 55% to 60%. Wall motion was normal; there were no regional wall motion abnormalities. Left ventricular diastolic function parameters were normal for the patient&'s age. - Aortic valve: Trileaflet; mildly calcified leaflets. There was mild regurgitation. - Mitral valve: Calcified annulus. There was trivial regurgitation. - Right atrium: Central venous pressure (est): 3 mm Hg. - Atrial septum: No defect or patent foramen ovale was identified. - Tricuspid valve: There was trivial regurgitation. - Pulmonary arteries: Systolic pressure could not be accurately estimated. - Pericardium, extracardiac: There was no pericardial effusion.  Impressions:  - Normal LV wall thickness with LVEF 18-29%, normal diastolic function. Mildly sclerotic aortic valve with mild aortic regurgitation. No PFO or ASD.  Transthoracic echocardiography. M-mode, complete 2D, spectral Doppler, and color Doppler. Birthdate: Patient birthdate: 1946/04/25. Age: Patient is 69 yr old. Sex: Gender: male. BMI: 20.2 kg/m^2. Blood pressure:   137/78 Patient status: Inpatient. Study date: Study date: 02/24/2015. Study time: 01:54 PM. Location: Bedside.   Admission HPI: Phillip Richardson is a 69 year old tobacco user presenting with left sided numbness/tingling. He reports his symptoms started around noon today with numbness on the left side of his face,  tingling in his left fingers, and numbness in the left leg. This lasted for 15-20 minutes. It returned 1 hour later and lasted again for 15-20 minutes. Last episode was at [facility] with tingling in fingers. Symptoms have all resolved completely. Denies weakness. Denies past episodes. Denies fevers, chills, vision changes, trouble swallowing, coughing, blurred speech, dizziness, lightheadedness.  Hospital Course by problem list: 1. TIA (transient ischemic attack): He has a risk factor for stroke which is active smoking. CT head demonstrates no acute processes. EKG demonstrates no acute ischemic changes. Initial troponin negative. He was admitted for observation. He was seen by neurology in consultation. MRI/MRA without acute changes. Carotid dopplers were within normal limits. Echo with LV EF 55-60% and normal diastolic function. He was assessed by PT/OT who determined he had no PT/OT needs. He was started on ASA 81mg  daily. At discharge, he had no further episodes and will follow up with neurology in 8 weeks.  2. Tobacco use: Counseled on cessation.  3. Elevated serum glucose: Hgb A1c 5.7%  4. Hyperlipidemia: LDL 147. He was started on atorvastatin 40mg  daily.  Discharge Vitals:   BP 152/83 mmHg  Pulse 61  Temp(Src) 98.4 F (  36.9 C) (Oral)  Resp 20  Ht 6\' 2"  (1.88 m)  Wt 157 lb (71.215 kg)  BMI 20.15 kg/m2  SpO2 99%  Discharge Labs:  No results found for this or any previous visit (from the past 24 hour(s)).  Signed: Milagros Loll, MD 02/27/2015, 8:17 AM    Services Ordered on Discharge: None Equipment Ordered on Discharge: None

## 2015-03-14 DIAGNOSIS — I10 Essential (primary) hypertension: Secondary | ICD-10-CM | POA: Diagnosis not present

## 2015-03-14 DIAGNOSIS — R7309 Other abnormal glucose: Secondary | ICD-10-CM | POA: Diagnosis not present

## 2015-03-14 DIAGNOSIS — Z8673 Personal history of transient ischemic attack (TIA), and cerebral infarction without residual deficits: Secondary | ICD-10-CM | POA: Diagnosis not present

## 2015-03-14 DIAGNOSIS — E785 Hyperlipidemia, unspecified: Secondary | ICD-10-CM | POA: Diagnosis not present

## 2015-03-28 ENCOUNTER — Encounter (INDEPENDENT_AMBULATORY_CARE_PROVIDER_SITE_OTHER): Payer: Self-pay | Admitting: *Deleted

## 2015-03-28 DIAGNOSIS — I10 Essential (primary) hypertension: Secondary | ICD-10-CM | POA: Diagnosis not present

## 2015-03-28 DIAGNOSIS — Z23 Encounter for immunization: Secondary | ICD-10-CM | POA: Diagnosis not present

## 2015-05-02 DIAGNOSIS — S8391XA Sprain of unspecified site of right knee, initial encounter: Secondary | ICD-10-CM | POA: Diagnosis not present

## 2015-06-27 DIAGNOSIS — I10 Essential (primary) hypertension: Secondary | ICD-10-CM | POA: Diagnosis not present

## 2015-06-27 DIAGNOSIS — J40 Bronchitis, not specified as acute or chronic: Secondary | ICD-10-CM | POA: Diagnosis not present

## 2015-06-27 DIAGNOSIS — F17200 Nicotine dependence, unspecified, uncomplicated: Secondary | ICD-10-CM | POA: Diagnosis not present

## 2015-06-27 DIAGNOSIS — E785 Hyperlipidemia, unspecified: Secondary | ICD-10-CM | POA: Diagnosis not present

## 2015-09-20 DIAGNOSIS — E785 Hyperlipidemia, unspecified: Secondary | ICD-10-CM | POA: Diagnosis not present

## 2015-09-20 DIAGNOSIS — Z23 Encounter for immunization: Secondary | ICD-10-CM | POA: Diagnosis not present

## 2015-09-20 DIAGNOSIS — I10 Essential (primary) hypertension: Secondary | ICD-10-CM | POA: Diagnosis not present

## 2015-09-26 DIAGNOSIS — I10 Essential (primary) hypertension: Secondary | ICD-10-CM | POA: Diagnosis not present

## 2015-12-27 DIAGNOSIS — Z8673 Personal history of transient ischemic attack (TIA), and cerebral infarction without residual deficits: Secondary | ICD-10-CM | POA: Diagnosis not present

## 2015-12-27 DIAGNOSIS — I10 Essential (primary) hypertension: Secondary | ICD-10-CM | POA: Diagnosis not present

## 2015-12-27 DIAGNOSIS — E785 Hyperlipidemia, unspecified: Secondary | ICD-10-CM | POA: Diagnosis not present

## 2016-02-04 DIAGNOSIS — J302 Other seasonal allergic rhinitis: Secondary | ICD-10-CM | POA: Diagnosis not present

## 2016-02-04 DIAGNOSIS — F17200 Nicotine dependence, unspecified, uncomplicated: Secondary | ICD-10-CM | POA: Diagnosis not present

## 2016-02-04 DIAGNOSIS — J209 Acute bronchitis, unspecified: Secondary | ICD-10-CM | POA: Diagnosis not present

## 2016-03-20 DIAGNOSIS — F17200 Nicotine dependence, unspecified, uncomplicated: Secondary | ICD-10-CM | POA: Diagnosis not present

## 2016-03-20 DIAGNOSIS — I10 Essential (primary) hypertension: Secondary | ICD-10-CM | POA: Diagnosis not present

## 2016-03-20 DIAGNOSIS — Z8673 Personal history of transient ischemic attack (TIA), and cerebral infarction without residual deficits: Secondary | ICD-10-CM | POA: Diagnosis not present

## 2016-03-20 DIAGNOSIS — E785 Hyperlipidemia, unspecified: Secondary | ICD-10-CM | POA: Diagnosis not present

## 2016-03-24 DIAGNOSIS — Z23 Encounter for immunization: Secondary | ICD-10-CM | POA: Diagnosis not present

## 2016-07-23 DIAGNOSIS — I10 Essential (primary) hypertension: Secondary | ICD-10-CM | POA: Diagnosis not present

## 2016-07-23 DIAGNOSIS — R7309 Other abnormal glucose: Secondary | ICD-10-CM | POA: Diagnosis not present

## 2016-07-23 DIAGNOSIS — E785 Hyperlipidemia, unspecified: Secondary | ICD-10-CM | POA: Diagnosis not present

## 2016-07-23 DIAGNOSIS — Z0000 Encounter for general adult medical examination without abnormal findings: Secondary | ICD-10-CM | POA: Diagnosis not present

## 2016-07-23 DIAGNOSIS — Z8673 Personal history of transient ischemic attack (TIA), and cerebral infarction without residual deficits: Secondary | ICD-10-CM | POA: Diagnosis not present

## 2016-07-23 DIAGNOSIS — F17200 Nicotine dependence, unspecified, uncomplicated: Secondary | ICD-10-CM | POA: Diagnosis not present

## 2016-07-23 DIAGNOSIS — Z23 Encounter for immunization: Secondary | ICD-10-CM | POA: Diagnosis not present

## 2016-08-28 DIAGNOSIS — F17200 Nicotine dependence, unspecified, uncomplicated: Secondary | ICD-10-CM | POA: Diagnosis not present

## 2016-08-28 DIAGNOSIS — J209 Acute bronchitis, unspecified: Secondary | ICD-10-CM | POA: Diagnosis not present

## 2016-10-22 DIAGNOSIS — I10 Essential (primary) hypertension: Secondary | ICD-10-CM | POA: Diagnosis not present

## 2016-10-22 DIAGNOSIS — J410 Simple chronic bronchitis: Secondary | ICD-10-CM | POA: Diagnosis not present

## 2016-10-22 DIAGNOSIS — F17200 Nicotine dependence, unspecified, uncomplicated: Secondary | ICD-10-CM | POA: Diagnosis not present

## 2017-01-29 DIAGNOSIS — I10 Essential (primary) hypertension: Secondary | ICD-10-CM | POA: Diagnosis not present

## 2017-01-29 DIAGNOSIS — E785 Hyperlipidemia, unspecified: Secondary | ICD-10-CM | POA: Diagnosis not present

## 2017-01-29 DIAGNOSIS — F17200 Nicotine dependence, unspecified, uncomplicated: Secondary | ICD-10-CM | POA: Diagnosis not present

## 2017-04-22 DIAGNOSIS — F17200 Nicotine dependence, unspecified, uncomplicated: Secondary | ICD-10-CM | POA: Diagnosis not present

## 2017-04-22 DIAGNOSIS — I10 Essential (primary) hypertension: Secondary | ICD-10-CM | POA: Diagnosis not present

## 2017-04-22 DIAGNOSIS — E785 Hyperlipidemia, unspecified: Secondary | ICD-10-CM | POA: Diagnosis not present

## 2017-07-23 DIAGNOSIS — I10 Essential (primary) hypertension: Secondary | ICD-10-CM | POA: Diagnosis not present

## 2017-07-23 DIAGNOSIS — E785 Hyperlipidemia, unspecified: Secondary | ICD-10-CM | POA: Diagnosis not present

## 2017-07-23 DIAGNOSIS — Z23 Encounter for immunization: Secondary | ICD-10-CM | POA: Diagnosis not present

## 2017-10-22 DIAGNOSIS — F17200 Nicotine dependence, unspecified, uncomplicated: Secondary | ICD-10-CM | POA: Diagnosis not present

## 2017-10-22 DIAGNOSIS — Z130 Encounter for screening for diseases of the blood and blood-forming organs and certain disorders involving the immune mechanism: Secondary | ICD-10-CM | POA: Diagnosis not present

## 2017-10-22 DIAGNOSIS — I10 Essential (primary) hypertension: Secondary | ICD-10-CM | POA: Diagnosis not present

## 2017-10-22 DIAGNOSIS — F17210 Nicotine dependence, cigarettes, uncomplicated: Secondary | ICD-10-CM | POA: Diagnosis not present

## 2017-10-22 DIAGNOSIS — Z8673 Personal history of transient ischemic attack (TIA), and cerebral infarction without residual deficits: Secondary | ICD-10-CM | POA: Diagnosis not present

## 2017-10-22 DIAGNOSIS — R7309 Other abnormal glucose: Secondary | ICD-10-CM | POA: Diagnosis not present

## 2017-10-22 DIAGNOSIS — Z1389 Encounter for screening for other disorder: Secondary | ICD-10-CM | POA: Diagnosis not present

## 2017-10-22 DIAGNOSIS — E785 Hyperlipidemia, unspecified: Secondary | ICD-10-CM | POA: Diagnosis not present

## 2017-10-22 DIAGNOSIS — Z0000 Encounter for general adult medical examination without abnormal findings: Secondary | ICD-10-CM | POA: Diagnosis not present

## 2017-11-11 DIAGNOSIS — M25561 Pain in right knee: Secondary | ICD-10-CM | POA: Diagnosis not present

## 2017-12-16 DIAGNOSIS — R69 Illness, unspecified: Secondary | ICD-10-CM | POA: Diagnosis not present

## 2017-12-29 DIAGNOSIS — R69 Illness, unspecified: Secondary | ICD-10-CM | POA: Diagnosis not present

## 2018-02-18 DIAGNOSIS — Z8673 Personal history of transient ischemic attack (TIA), and cerebral infarction without residual deficits: Secondary | ICD-10-CM | POA: Diagnosis not present

## 2018-02-18 DIAGNOSIS — E785 Hyperlipidemia, unspecified: Secondary | ICD-10-CM | POA: Diagnosis not present

## 2018-02-18 DIAGNOSIS — F17200 Nicotine dependence, unspecified, uncomplicated: Secondary | ICD-10-CM | POA: Diagnosis not present

## 2018-02-18 DIAGNOSIS — I10 Essential (primary) hypertension: Secondary | ICD-10-CM | POA: Diagnosis not present

## 2018-02-18 DIAGNOSIS — R69 Illness, unspecified: Secondary | ICD-10-CM | POA: Diagnosis not present

## 2018-05-20 DIAGNOSIS — I10 Essential (primary) hypertension: Secondary | ICD-10-CM | POA: Diagnosis not present

## 2018-05-20 DIAGNOSIS — J441 Chronic obstructive pulmonary disease with (acute) exacerbation: Secondary | ICD-10-CM | POA: Diagnosis not present

## 2018-05-20 DIAGNOSIS — E785 Hyperlipidemia, unspecified: Secondary | ICD-10-CM | POA: Diagnosis not present

## 2018-05-20 DIAGNOSIS — R69 Illness, unspecified: Secondary | ICD-10-CM | POA: Diagnosis not present

## 2018-08-19 DIAGNOSIS — I10 Essential (primary) hypertension: Secondary | ICD-10-CM | POA: Diagnosis not present

## 2018-08-19 DIAGNOSIS — E785 Hyperlipidemia, unspecified: Secondary | ICD-10-CM | POA: Diagnosis not present

## 2018-08-19 DIAGNOSIS — J449 Chronic obstructive pulmonary disease, unspecified: Secondary | ICD-10-CM | POA: Diagnosis not present

## 2018-08-19 DIAGNOSIS — Z23 Encounter for immunization: Secondary | ICD-10-CM | POA: Diagnosis not present

## 2018-08-19 DIAGNOSIS — R69 Illness, unspecified: Secondary | ICD-10-CM | POA: Diagnosis not present

## 2018-10-12 ENCOUNTER — Ambulatory Visit (INDEPENDENT_AMBULATORY_CARE_PROVIDER_SITE_OTHER): Payer: Medicare HMO

## 2018-10-12 ENCOUNTER — Telehealth: Payer: Self-pay | Admitting: Gastroenterology

## 2018-10-12 DIAGNOSIS — Z1211 Encounter for screening for malignant neoplasm of colon: Secondary | ICD-10-CM

## 2018-10-12 MED ORDER — PEG-KCL-NACL-NASULF-NA ASC-C 100 G PO SOLR: 1.0000 | ORAL | 0 refills | Status: DC

## 2018-10-12 MED ORDER — PEG 3350-KCL-NA BICARB-NACL 420 G PO SOLR: 4000.0000 mL | ORAL | 0 refills | Status: DC

## 2018-10-12 NOTE — Telephone Encounter (Signed)
[phone]  PLEASE CALL PATIENT, HE NEEDS TO CHANGE THE PREP FOR HIS TCS

## 2018-10-12 NOTE — Telephone Encounter (Signed)
Spoke with the pt, prep was too expensive- see triage.

## 2018-10-12 NOTE — Patient Instructions (Addendum)
Phillip Richardson   12/04/1945 MRN: [id number]    Procedure Date: 12/10/18 Time to register: 7:30am Place to register: [facility] Procedure Time: 8:30am Scheduled provider: Barney Drain, MD  PREPARATION FOR COLONOSCOPY WITH TRI-LYTE SPLIT PREP  Please notify us immediately if you are diabetic, take iron supplements, or if you are on Coumadin or any other blood thinners.   You will need to purchase 1 fleet enema and 1 box of Bisacodyl '5mg'$  tablets. These are available over the counter and you may purchase them at your drug store.    1 DAY BEFORE PROCEDURE:  DATE: 12/09/18   DAY: Thursday  clear liquids the entire day - NO SOLID FOOD.   At 2:00 pm:  Take 2 Bisacodyl tablets.   At 4:00pm:  Start drinking your solution. Make sure you mix well per instructions on the bottle. Try to drink 1 (one) 8 ounce glass every 10-15 minutes until you have consumed HALF the jug. You should complete by 6:00pm.You must keep the left over solution refrigerated until completed next day.  Continue clear liquids. You must drink plenty of clear liquids to prevent dehyration and kidney failure.     DAY OF PROCEDURE:   DATE: 12/10/18   DAY: Friday If you take medications for your heart, blood pressure or breathing, you may take these medications.   Five hours before your procedure time @ 3:30am:  Finish remaining amout of bowel prep, drinking 1 (one) 8 ounce glass every 10-15 minutes until complete. You have two hours to consume remaining prep.   Three hours before your procedure time '@5'$ :30am:  Nothing by mouth.   At least one hour before going to the hospital:  Give yourself one Fleet enema.  You may take your morning medications with sip of water unless we have instructed otherwise.      Please see below for Dietary Information.  CLEAR LIQUIDS INCLUDE:  Water Jello (NOT red in  color)   Ice Popsicles (NOT red in color)   Tea (sugar ok, no milk/cream) Powdered fruit flavored drinks  Coffee (sugar ok, no milk/cream) Gatorade/ Lemonade/ Kool-Aid  (NOT red in color)   Juice: apple, white grape, white cranberry Soft drinks  Clear bullion, consomme, broth (fat free beef/chicken/vegetable)  Carbonated beverages (any kind)  Strained chicken noodle soup Hard Candy   Remember: Clear liquids are liquids that will allow you to see your fingers on the other side of a clear glass. Be sure liquids are NOT red in color, and not cloudy, but CLEAR.  DO NOT EAT OR DRINK ANY OF THE FOLLOWING:  Dairy products of any kind   Cranberry juice Tomato juice / V8 juice   Grapefruit juice Orange juice     Red grape juice  Do not eat any solid foods, including such foods  as: cereal, oatmeal, yogurt, fruits, vegetables, creamed soups, eggs, bread, crackers, pureed foods in a blender, etc.   HELPFUL HINTS FOR DRINKING PREP SOLUTION:   Make sure prep is extremely cold. Mix and refrigerate the the morning of the prep. You may also put in the freezer.   You may try mixing some Crystal Light or Country Time Lemonade if you prefer. Mix in small amounts; add more if necessary.  Try drinking through a straw  Rinse mouth with water or a mouthwash between glasses, to remove after-taste.  Try sipping on a cold beverage /ice/ popsicles between glasses of prep.  Place a piece of sugar-free hard candy in mouth between glasses.  If you become nauseated, try consuming smaller amounts, or stretch out the time between glasses. Stop for 30-60 minutes, then slowly start back drinking.        OTHER INSTRUCTIONS  You will need a responsible adult at least 70 years of age to accompany you and drive you home. This person must remain in the waiting room during your procedure. The hospital will cancel your procedure if you do not have a responsible adult with you.   1. Wear loose fitting clothing that  is easily removed. 2. Leave jewelry and other valuables at home.  3. Remove all body piercing jewelry and leave at home. 4. Total time from sign-in until discharge is approximately 2-3 hours. 5. You should go home directly after your procedure and rest. You can resume normal activities the day after your procedure. 6. The day of your procedure you should not:  Drive  Make legal decisions  Operate machinery  Drink alcohol  Return to work   You may call the office (Dept: [phone]) before 5:00pm, or page the doctor on call [phone]) after 5:00pm, for further instructions, if necessary.   Insurance Information YOU WILL NEED TO CHECK WITH YOUR INSURANCE COMPANY FOR THE BENEFITS OF COVERAGE YOU HAVE FOR THIS PROCEDURE.  UNFORTUNATELY, NOT ALL INSURANCE COMPANIES HAVE BENEFITS TO COVER ALL OR PART OF THESE TYPES OF PROCEDURES.  IT IS YOUR RESPONSIBILITY TO CHECK YOUR BENEFITS, HOWEVER, WE WILL BE GLAD TO ASSIST YOU WITH ANY CODES YOUR INSURANCE COMPANY MAY NEED.    PLEASE NOTE THAT MOST INSURANCE COMPANIES WILL NOT COVER A SCREENING COLONOSCOPY FOR PEOPLE UNDER THE AGE OF 50  IF YOU HAVE BCBS INSURANCE, YOU MAY HAVE BENEFITS FOR A SCREENING COLONOSCOPY BUT IF POLYPS ARE FOUND THE DIAGNOSIS WILL CHANGE AND THEN YOU MAY HAVE A DEDUCTIBLE THAT WILL NEED TO BE MET. SO PLEASE MAKE SURE YOU CHECK YOUR BENEFITS FOR A SCREENING COLONOSCOPY AS WELL AS A DIAGNOSTIC COLONOSCOPY.

## 2018-10-12 NOTE — Progress Notes (Signed)
Pt called- moviprep was too expensive, rx sent in for trilyte, new instructions done and mailed to the pt.

## 2018-10-12 NOTE — Progress Notes (Signed)
Gastroenterology Pre-Procedure Review  Request Date:10/13/19 Requesting Physician: Dr.Fanta- no previous tcs  PATIENT REVIEW QUESTIONS: The patient responded to the following health history questions as indicated:    1. Diabetes Melitis: no 2. Joint replacements in the past 12 months: no 3. Major health problems in the past 3 months: no 4. Has an artificial valve or MVP: no 5. Has a defibrillator: no 6. Has been advised in past to take antibiotics in advance of a procedure like teeth cleaning: no 7. Family history of colon cancer: no  8. Alcohol Use: no 9. History of sleep apnea: no  10. History of coronary artery or other vascular stents placed within the last 12 months: no 11. History of any prior anesthesia complications: no    MEDICATIONS & ALLERGIES:    Patient reports the following regarding taking any blood thinners:   Plavix? no Aspirin? yes (81mg ) Coumadin? no Brilinta? no Xarelto? no Eliquis? no Pradaxa? no Savaysa? no Effient? no  Patient confirms/reports the following medications:  Current Outpatient Medications  Medication Sig Dispense Refill  . aspirin 81 MG chewable tablet Chew 1 tablet (81 mg total) by mouth daily. 30 tablet 0  . atorvastatin (LIPITOR) 40 MG tablet Take 1 tablet (40 mg total) by mouth daily at 6 PM. (Patient taking differently: Take 10 mg by mouth daily at 6 PM. ) 30 tablet 0  . lisinopril-hydrochlorothiazide (PRINZIDE,ZESTORETIC) 20-12.5 MG tablet Take 1 tablet by mouth daily.  3   No current facility-administered medications for this visit.     Patient confirms/reports the following allergies:  No Known Allergies  No orders of the defined types were placed in this encounter.   AUTHORIZATION INFORMATION Primary Insurance: Bernadene Person,  ID #: [id number] Pre-Cert / Auth required:no  SCHEDULE INFORMATION: Procedure has been scheduled as follows:  Date: 12/10/18, Time: 8:30am Location: [facility] Dr.Fields  This Gastroenterology  Pre-Precedure Review Form is being routed to the following provider(s): Walden Field NP

## 2018-10-13 NOTE — Progress Notes (Signed)
Ok to schedule.

## 2018-10-18 DIAGNOSIS — E785 Hyperlipidemia, unspecified: Secondary | ICD-10-CM | POA: Diagnosis not present

## 2018-10-18 DIAGNOSIS — Z1331 Encounter for screening for depression: Secondary | ICD-10-CM | POA: Diagnosis not present

## 2018-10-18 DIAGNOSIS — R7309 Other abnormal glucose: Secondary | ICD-10-CM | POA: Diagnosis not present

## 2018-10-18 DIAGNOSIS — Z0000 Encounter for general adult medical examination without abnormal findings: Secondary | ICD-10-CM | POA: Diagnosis not present

## 2018-10-18 DIAGNOSIS — R69 Illness, unspecified: Secondary | ICD-10-CM | POA: Diagnosis not present

## 2018-10-18 DIAGNOSIS — Z1389 Encounter for screening for other disorder: Secondary | ICD-10-CM | POA: Diagnosis not present

## 2018-10-18 DIAGNOSIS — Z8673 Personal history of transient ischemic attack (TIA), and cerebral infarction without residual deficits: Secondary | ICD-10-CM | POA: Diagnosis not present

## 2018-10-18 DIAGNOSIS — I10 Essential (primary) hypertension: Secondary | ICD-10-CM | POA: Diagnosis not present

## 2018-12-10 ENCOUNTER — Other Ambulatory Visit: Payer: Self-pay

## 2018-12-10 ENCOUNTER — Ambulatory Visit (HOSPITAL_COMMUNITY)
Admission: RE | Admit: 2018-12-10 | Discharge: 2018-12-10 | Disposition: A | Discharge status: Home / Self Care | Payer: Medicare HMO | Attending: Gastroenterology | Admitting: Gastroenterology

## 2018-12-10 ENCOUNTER — Encounter (HOSPITAL_COMMUNITY): Payer: Self-pay | Admitting: *Deleted

## 2018-12-10 ENCOUNTER — Encounter (HOSPITAL_COMMUNITY)
Admission: RE | Disposition: A | Discharge status: Home / Self Care | Payer: Self-pay | Source: Home / Self Care | Attending: Gastroenterology

## 2018-12-10 DIAGNOSIS — Z7982 Long term (current) use of aspirin: Secondary | ICD-10-CM | POA: Diagnosis not present

## 2018-12-10 DIAGNOSIS — K648 Other hemorrhoids: Secondary | ICD-10-CM | POA: Insufficient documentation

## 2018-12-10 DIAGNOSIS — Z79899 Other long term (current) drug therapy: Secondary | ICD-10-CM | POA: Insufficient documentation

## 2018-12-10 DIAGNOSIS — F17210 Nicotine dependence, cigarettes, uncomplicated: Secondary | ICD-10-CM | POA: Diagnosis not present

## 2018-12-10 DIAGNOSIS — Z8673 Personal history of transient ischemic attack (TIA), and cerebral infarction without residual deficits: Secondary | ICD-10-CM | POA: Insufficient documentation

## 2018-12-10 DIAGNOSIS — R69 Illness, unspecified: Secondary | ICD-10-CM | POA: Diagnosis not present

## 2018-12-10 DIAGNOSIS — M1990 Unspecified osteoarthritis, unspecified site: Secondary | ICD-10-CM | POA: Insufficient documentation

## 2018-12-10 DIAGNOSIS — D122 Benign neoplasm of ascending colon: Secondary | ICD-10-CM | POA: Diagnosis not present

## 2018-12-10 DIAGNOSIS — Q438 Other specified congenital malformations of intestine: Secondary | ICD-10-CM | POA: Diagnosis not present

## 2018-12-10 DIAGNOSIS — Z1211 Encounter for screening for malignant neoplasm of colon: Secondary | ICD-10-CM | POA: Diagnosis not present

## 2018-12-10 DIAGNOSIS — K5730 Diverticulosis of large intestine without perforation or abscess without bleeding: Secondary | ICD-10-CM | POA: Insufficient documentation

## 2018-12-10 HISTORY — PX: COLONOSCOPY: SHX5424

## 2018-12-10 HISTORY — PX: POLYPECTOMY: SHX5525

## 2018-12-10 HISTORY — DX: Unspecified osteoarthritis, unspecified site: M19.90

## 2018-12-10 HISTORY — DX: Transient cerebral ischemic attack, unspecified: G45.9

## 2018-12-10 SURGERY — COLONOSCOPY
Anesthesia: Moderate Sedation

## 2018-12-10 MED ORDER — SODIUM CHLORIDE 0.9 % IV SOLN
INTRAVENOUS | Status: DC
  Administered 2018-12-10: 1000 mL via INTRAVENOUS

## 2018-12-10 MED ORDER — MEPERIDINE HCL 100 MG/ML IJ SOLN
INTRAMUSCULAR | Status: DC | PRN
  Administered 2018-12-10: 50 mg
  Administered 2018-12-10: 25 mg

## 2018-12-10 MED ORDER — MIDAZOLAM HCL 5 MG/5ML IJ SOLN
INTRAMUSCULAR | Status: DC | PRN
  Administered 2018-12-10: 2 mg via INTRAVENOUS
  Administered 2018-12-10: 1 mg via INTRAVENOUS
  Administered 2018-12-10: 2 mg via INTRAVENOUS

## 2018-12-10 MED ORDER — MEPERIDINE HCL 100 MG/ML IJ SOLN
INTRAMUSCULAR | Status: AC
  Filled 2018-12-10: qty 2

## 2018-12-10 MED ORDER — MIDAZOLAM HCL 5 MG/5ML IJ SOLN
INTRAMUSCULAR | Status: AC
  Filled 2018-12-10: qty 10

## 2018-12-10 MED ORDER — STERILE WATER FOR IRRIGATION IR SOLN
Status: DC | PRN
  Administered 2018-12-10: 09:00:00

## 2018-12-10 NOTE — Op Note (Signed)
[facility] Patient Name: Phillip Richardson Procedure Date: 12/10/2018 7:37 AM MRN: [id number] Date of Birth: [birth year] Attending MD: Barney Drain MD, MD CSN: [id number] Age: 73 Admit Type: Outpatient Procedure:                Colonoscopy WITH COLD SNARE/SNARE CAUTERY                            POLYPECTOMY Indications:              Screening for colorectal malignant neoplasm Providers:                Barney Drain MD, MD, Janeece Riggers, RN, Rosina Lowenstein,                            RN Referring MD:             Rosita Fire MD, MD Medicines:                Meperidine 75 mg IV, Midazolam 5 mg IV Complications:            No immediate complications. Estimated Blood Loss:     Estimated blood loss was minimal. Procedure:                Pre-Anesthesia Assessment:                           - Prior to the procedure, a History and Physical                            was performed, and patient medications and                            allergies were reviewed. The patient's tolerance of                            previous anesthesia was also reviewed. The risks                            and benefits of the procedure and the sedation                            options and risks were discussed with the patient.                            All questions were answered, and informed consent                            was obtained. Prior Anticoagulants: The patient                            last took aspirin on the day of the procedure. ASA                            Grade Assessment: II - A patient with mild systemic  disease. After reviewing the risks and benefits,                            the patient was deemed in satisfactory condition to                            undergo the procedure. After obtaining informed                            consent, the colonoscope was passed under direct                            vision. Throughout the procedure, the patient's                      blood pressure, pulse, and oxygen saturations were                            monitored continuously. The CF-HQ190L ([id number])                            scope was introduced through the anus and advanced                            to the the cecum, identified by appendiceal orifice                            and ileocecal valve. The colonoscopy was extremely                            difficult due to restricted mobility of the colon.                            Successful completion of the procedure was aided by                            withdrawing the scope and replacing with the                            pediatric colonoscope and COLOWRAP. The patient                            tolerated the procedure well. The quality of the                            bowel preparation was good. The ileocecal valve,                            appendiceal orifice, and rectum were photographed. Scope In: 9:04:09 AM Scope Out: 9:29:44 AM Scope Withdrawal Time: 0 hours 22 minutes 46 seconds  Total Procedure Duration: 0 hours 25 minutes 35 seconds  Findings:      A 5 mm polyp was found in the proximal ascending colon. The polyp was       sessile.  The polyp was removed with a cold snare. Resection and       retrieval were complete.      A 8 mm polyp was found in the proximal ascending colon. The polyp was       sessile. The polyp was removed with a hot snare. Resection and retrieval       were complete.      Multiple small and large-mouthed diverticula were found in the       recto-sigmoid colon, sigmoid colon, descending colon, splenic flexure       and ascending colon.      Internal hemorrhoids were found. The hemorrhoids were small.      The recto-sigmoid colon and sigmoid colon were grossly tortuous. Impression:               - One 5 mm polyp in the proximal ascending colon,                            removed with a cold snare. Resected and retrieved.                           -  One 8 mm polyp in the proximal ascending colon,                            removed with a hot snare. Resected and retrieved                            REQUIRING A RETRIEVAL NET                           - Diverticulosis in the recto-sigmoid colon, in the                            sigmoid colon, in the descending colon, at the                            splenic flexure and in the ascending colon.                           - Internal hemorrhoids.                           - Tortuous RECTOSIGMOID colon. Moderate Sedation:      Moderate (conscious) sedation was administered by the endoscopy nurse       and supervised by the endoscopist. The following parameters were       monitored: oxygen saturation, heart rate, blood pressure, and response       to care. Total physician intraservice time was 25 minutes. Recommendation:           - Patient has a contact number available for                            emergencies. The signs and symptoms of potential                            delayed complications were discussed with the  patient. Return to normal activities tomorrow.                            Written discharge instructions were provided to the                            patient.                           - High fiber diet.                           - Continue present medications.                           - Await pathology results.                           - Repeat colonoscopy in 5-10 years for surveillance                            WITH PEDS COLONOSCOPE. Procedure Code(s):        --- Professional ---                           [phone], Colonoscopy, flexible; with removal of                            tumor(s), polyp(s), or other lesion(s) by snare                            technique                           99153, Moderate sedation; each additional 15                            minutes intraservice time                           G0500, Moderate sedation  services provided by the                            same physician or other qualified health care                            professional performing a gastrointestinal                            endoscopic service that sedation supports,                            requiring the presence of an independent trained                            observer to assist in the monitoring of the  patient's level of consciousness and physiological                            status; initial 15 minutes of intra-service time;                            patient age 24 years or older (additional time may                            be reported with [id number], as appropriate) Diagnosis Code(s):        --- Professional ---                           Z12.11, Encounter for screening for malignant                            neoplasm of colon                           D12.2, Benign neoplasm of ascending colon                           K64.8, Other hemorrhoids                           K57.30, Diverticulosis of large intestine without                            perforation or abscess without bleeding                           Q43.8, Other specified congenital malformations of                            intestine CPT copyright 2018 [employer]. All rights reserved. The codes documented in this report are preliminary and upon coder review may  be revised to meet current compliance requirements. Barney Drain, MD Barney Drain MD, MD 12/10/2018 9:47:08 AM This report has been signed electronically. Number of Addenda: 0

## 2018-12-10 NOTE — Discharge Instructions (Signed)
You have small internal hemorrhoids and diverticulosis IN YOUR LEFT AND RIGHT COLON. YOU HAD TWO POLYPS REMOVED. YOU HAVE A ANGULATED LEFT COLON DUE TO DIVERTICULOSIS.    DRINK WATER TO KEEP YOUR URINE LIGHT YELLOW.  FOLLOW A HIGH FIBER DIET. AVOID ITEMS THAT CAUSE BLOATING. See info below.  YOUR BIOPSY RESULTS WILL BE BACK IN 5 BUSINESS DAYS.  USE PREPARATION H FOUR TIMES  A DAY IF NEEDED TO RELIEVE RECTAL PAIN/PRESSURE/BLEEDING.  Next colonoscopy in 5-10 YEARS WITH A PEDIATRIC COLONOSCOPE.  Colonoscopy Care After Read the instructions outlined below and refer to this sheet in the next week. These discharge instructions provide you with general information on caring for yourself after you leave the hospital. While your treatment has been planned according to the most current medical practices available, unavoidable complications occasionally occur. If you have any problems or questions after discharge, call DR. Loyce Klasen, [phone].  ACTIVITY  You may resume your regular activity, but move at a slower pace for the next 24 hours.   Take frequent rest periods for the next 24 hours.   Walking will help get rid of the air and reduce the bloated feeling in your belly (abdomen).   No driving for 24 hours (because of the medicine (anesthesia) used during the test).   You may shower.   Do not sign any important legal documents or operate any machinery for 24 hours (because of the anesthesia used during the test).    NUTRITION  Drink plenty of fluids.   You may resume your normal diet as instructed by your doctor.   Begin with a light meal and progress to your normal diet. Heavy or fried foods are harder to digest and may make you feel sick to your stomach (nauseated).   Avoid alcoholic beverages for 24 hours or as instructed.    MEDICATIONS  You may resume your normal medications.   WHAT YOU CAN EXPECT TODAY  Some feelings of bloating in the abdomen.   Passage of more  gas than usual.   Spotting of blood in your stool or on the toilet paper  .  IF YOU HAD POLYPS REMOVED DURING THE COLONOSCOPY:  Eat a soft diet IF YOU HAVE NAUSEA, BLOATING, ABDOMINAL PAIN, OR VOMITING.    FINDING OUT THE RESULTS OF YOUR TEST Not all test results are available during your visit. DR. Oneida Alar WILL CALL YOU WITHIN 14 DAYS OF YOUR PROCEDUE WITH YOUR RESULTS. Do not assume everything is normal if you have not heard from DR. Dhruva Orndoff, CALL HER OFFICE AT [phone].  SEEK IMMEDIATE MEDICAL ATTENTION AND CALL THE OFFICE: [phone] IF:  You have more than a spotting of blood in your stool.   Your belly is swollen (abdominal distention).   You are nauseated or vomiting.   You have a temperature over 101F.   You have abdominal pain or discomfort that is severe or gets worse throughout the day.  High-Fiber Diet A high-fiber diet changes your normal diet to include more whole grains, legumes, fruits, and vegetables. Changes in the diet involve replacing refined carbohydrates with unrefined foods. The calorie level of the diet is essentially unchanged. The Dietary Reference Intake (recommended amount) for adult males is 38 grams per day. For adult females, it is 25 grams per day. Pregnant and lactating women should consume 28 grams of fiber per day. Fiber is the intact part of a plant that is not broken down during digestion. Functional fiber is fiber that has been isolated from the  plant to provide a beneficial effect in the body.  PURPOSE  Increase stool bulk.   Ease and regulate bowel movements.   Lower cholesterol.   REDUCE RISK OF COLON CANCER  INDICATIONS THAT YOU NEED MORE FIBER  Constipation and hemorrhoids.   Uncomplicated diverticulosis (intestine condition) and irritable bowel syndrome.   Weight management.   As a protective measure against hardening of the arteries (atherosclerosis), diabetes, and cancer.   GUIDELINES FOR INCREASING FIBER IN THE  DIET  Start adding fiber to the diet slowly. A gradual increase of about 5 more grams (2 slices of whole-wheat bread, 2 servings of most fruits or vegetables, or 1 bowl of high-fiber cereal) per day is best. Too rapid an increase in fiber may result in constipation, flatulence, and bloating.   Drink enough water and fluids to keep your urine clear or pale yellow. Water, juice, or caffeine-free drinks are recommended. Not drinking enough fluid may cause constipation.   Eat a variety of high-fiber foods rather than one type of fiber.   Try to increase your intake of fiber through using high-fiber foods rather than fiber pills or supplements that contain small amounts of fiber.   The goal is to change the types of food eaten. Do not supplement your present diet with high-fiber foods, but replace foods in your present diet.   INCLUDE A VARIETY OF FIBER SOURCES  Replace refined and processed grains with whole grains, canned fruits with fresh fruits, and incorporate other fiber sources. White rice, white breads, and most bakery goods contain little or no fiber.   Brown whole-grain rice, buckwheat oats, and many fruits and vegetables are all good sources of fiber. These include: broccoli, Brussels sprouts, cabbage, cauliflower, beets, sweet potatoes, white potatoes (skin on), carrots, tomatoes, eggplant, squash, berries, fresh fruits, and dried fruits.   Cereals appear to be the richest source of fiber. Cereal fiber is found in whole grains and bran. Bran is the fiber-rich outer coat of cereal grain, which is largely removed in refining. In whole-grain cereals, the bran remains. In breakfast cereals, the largest amount of fiber is found in those with "bran" in their names. The fiber content is sometimes indicated on the label.   You may need to include additional fruits and vegetables each day.   In baking, for 1 cup white flour, you may use the following substitutions:   1 cup whole-wheat flour  minus 2 tablespoons.   1/2 cup white flour plus 1/2 cup whole-wheat flour.   Polyps, Colon  A polyp is extra tissue that grows inside your body. Colon polyps grow in the large intestine. The large intestine, also called the colon, is part of your digestive system. It is a long, hollow tube at the end of your digestive tract where your body makes and stores stool. Most polyps are not dangerous. They are benign. This means they are not cancerous. But over time, some types of polyps can turn into cancer. Polyps that are smaller than a pea are usually not harmful. But larger polyps could someday become or may already be cancerous. To be safe, doctors remove all polyps and test them.   PREVENTION There is not one sure way to prevent polyps. You might be able to lower your risk of getting them if you:  Eat more fruits and vegetables and less fatty food.   Do not smoke.   Avoid alcohol.   Exercise every day.   Lose weight if you are overweight.   Eating  more calcium and folate can also lower your risk of getting polyps. Some foods that are rich in calcium are milk, cheese, and broccoli. Some foods that are rich in folate are chickpeas, kidney beans, and spinach.    Diverticulosis Diverticulosis is a common condition that develops when small pouches (diverticula) form in the wall of the colon. The risk of diverticulosis increases with age. It happens more often in people who eat a low-fiber diet. Most individuals with diverticulosis have no symptoms. Those individuals with symptoms usually experience belly (abdominal) pain, constipation, or loose stools (diarrhea).  HOME CARE INSTRUCTIONS  Increase the amount of fiber in your diet as directed by your caregiver or dietician. This may reduce symptoms of diverticulosis.   Drink at least 6 to 8 glasses of water each day to prevent constipation.   Try not to strain when you have a bowel movement.   Avoiding nuts and seeds to prevent  complications is NOT NECESSARY.   FOODS HAVING HIGH FIBER CONTENT INCLUDE:  Fruits. Apple, peach, pear, tangerine, raisins, prunes.   Vegetables. Brussels sprouts, asparagus, broccoli, cabbage, carrot, cauliflower, romaine lettuce, spinach, summer squash, tomato, winter squash, zucchini.   Starchy Vegetables. Baked beans, kidney beans, lima beans, split peas, lentils, potatoes (with skin).   Grains. Whole wheat bread, brown rice, bran flake cereal, plain oatmeal, white rice, shredded wheat, bran muffins.   SEEK IMMEDIATE MEDICAL CARE IF:  You develop increasing pain or severe bloating.   You have an oral temperature above 101F.   You develop vomiting or bowel movements that are bloody or black.

## 2018-12-10 NOTE — H&P (Signed)
Primary Care Physician:  Rosita Fire, MD Primary Gastroenterologist:  Dr. Oneida Alar  Pre-Procedure History & Physical: HPI:  Phillip Richardson is a 73 y.o. male here for [place].  Past Medical History:  Diagnosis Date  . Arthritis   . TIA (transient ischemic attack) 2014    Past Surgical History:  Procedure Laterality Date  . BACK SURGERY    . KNEE SURGERY Left   . ROTATOR CUFF REPAIR Right     Prior to Admission medications   Medication Sig Start Date End Date Taking? Authorizing Provider  aspirin 81 MG chewable tablet Chew 1 tablet (81 mg total) by mouth daily. 02/24/15  Yes Milagros Loll, MD  atorvastatin (LIPITOR) 10 MG tablet Take 10 mg by mouth daily. 09/12/18  Yes [provider]  ibuprofen (ADVIL,MOTRIN) 200 MG tablet Take 400 mg by mouth every 8 (eight) hours as needed (for pain.).   Yes [provider]  lisinopril-hydrochlorothiazide (PRINZIDE,ZESTORETIC) 20-12.5 MG tablet Take 1 tablet by mouth daily. 09/12/18  Yes [provider]  polyethylene glycol-electrolytes (TRILYTE) 420 g solution Take 4,000 mLs by mouth as directed. 10/12/18  Yes Carlis Stable, NP  diphenhydramine-acetaminophen (TYLENOL PM) 25-500 MG TABS tablet Take 1 tablet by mouth at bedtime.    [provider]  peg 3350 powder (MOVIPREP) 100 g SOLR Take 1 kit (200 g total) by mouth as directed. 10/12/18   Carlis Stable, NP    Allergies as of 10/12/2018  . (No Known Allergies)    History reviewed. No pertinent family history.  Social History   Socioeconomic History  . Marital status: Widowed    Spouse name: Not on file  . Number of children: Not on file  . Years of education: Not on file  . Highest education level: Not on file  Occupational History  . Not on file  Social Needs  . Financial resource strain: Not on file  . Food insecurity:    Worry: Not on file    Inability: Not on file  . Transportation needs:    Medical: Not on file   Non-medical: Not on file  Tobacco Use  . Smoking status: Current Every Day Smoker    Types: Cigarettes  . Smokeless tobacco: Never Used  Substance and Sexual Activity  . Alcohol use: No  . Drug use: No  . Sexual activity: Not on file  Lifestyle  . Physical activity:    Days per week: Not on file    Minutes per session: Not on file  . Stress: Not on file  Relationships  . Social connections:    Talks on phone: Not on file    Gets together: Not on file    Attends religious service: Not on file    Active member of club or organization: Not on file    Attends meetings of clubs or organizations: Not on file    Relationship status: Not on file  . Intimate partner violence:    Fear of current or ex partner: Not on file    Emotionally abused: Not on file    Physically abused: Not on file    Forced sexual activity: Not on file  Other Topics Concern  . Not on file  Social History Narrative  . Not on file    Review of Systems: See HPI, otherwise negative ROS   Physical Exam: BP 129/82   Pulse 72   Temp (!) 97.3 F (36.3 C) (Oral)   Resp 14  Ht '6\' 2"'$  (1.88 m)   Wt 77.1 kg   SpO2 97%   BMI 21.83 kg/m  General:   Alert,  pleasant and cooperative in NAD Head:  Normocephalic and atraumatic. Neck:  Supple; Lungs:  Clear throughout to auscultation.    Heart:  Regular rate and rhythm. Abdomen:  Soft, nontender and nondistended. Normal bowel sounds, without guarding, and without rebound.   Neurologic:  Alert and  oriented x4;  grossly normal neurologically.  Impression/Plan:    SCREENING  Plan:  1. TCS TODAY DISCUSSED PROCEDURE, BENEFITS, & RISKS: < 1% chance of medication reaction, bleeding, perforation, or rupture of spleen/liver.

## 2018-12-13 NOTE — Progress Notes (Signed)
Called, many rings and no answer. Mailing a letter to call.  

## 2018-12-15 ENCOUNTER — Encounter (HOSPITAL_COMMUNITY): Payer: Self-pay | Admitting: Gastroenterology

## 2018-12-20 ENCOUNTER — Telehealth: Payer: Self-pay

## 2018-12-20 NOTE — Telephone Encounter (Signed)
PT received a letter to call for his results and he is now aware of his path results and plan. He had me update his new phone number in the computer [phone].

## 2019-01-20 DIAGNOSIS — E785 Hyperlipidemia, unspecified: Secondary | ICD-10-CM | POA: Diagnosis not present

## 2019-01-20 DIAGNOSIS — R69 Illness, unspecified: Secondary | ICD-10-CM | POA: Diagnosis not present

## 2019-01-20 DIAGNOSIS — I10 Essential (primary) hypertension: Secondary | ICD-10-CM | POA: Diagnosis not present

## 2019-07-19 DIAGNOSIS — E785 Hyperlipidemia, unspecified: Secondary | ICD-10-CM | POA: Diagnosis not present

## 2019-07-19 DIAGNOSIS — Z23 Encounter for immunization: Secondary | ICD-10-CM | POA: Diagnosis not present

## 2019-07-19 DIAGNOSIS — I10 Essential (primary) hypertension: Secondary | ICD-10-CM | POA: Diagnosis not present

## 2019-07-19 DIAGNOSIS — Z8673 Personal history of transient ischemic attack (TIA), and cerebral infarction without residual deficits: Secondary | ICD-10-CM | POA: Diagnosis not present

## 2019-07-19 DIAGNOSIS — R69 Illness, unspecified: Secondary | ICD-10-CM | POA: Diagnosis not present

## 2019-08-03 DIAGNOSIS — H6123 Impacted cerumen, bilateral: Secondary | ICD-10-CM | POA: Diagnosis not present

## 2019-09-02 DIAGNOSIS — I10 Essential (primary) hypertension: Secondary | ICD-10-CM | POA: Diagnosis not present

## 2019-09-02 DIAGNOSIS — E785 Hyperlipidemia, unspecified: Secondary | ICD-10-CM | POA: Diagnosis not present

## 2019-10-03 DIAGNOSIS — E785 Hyperlipidemia, unspecified: Secondary | ICD-10-CM | POA: Diagnosis not present

## 2019-10-03 DIAGNOSIS — I10 Essential (primary) hypertension: Secondary | ICD-10-CM | POA: Diagnosis not present

## 2019-11-10 ENCOUNTER — Other Ambulatory Visit (HOSPITAL_COMMUNITY): Payer: Self-pay | Admitting: Internal Medicine

## 2019-11-10 ENCOUNTER — Other Ambulatory Visit: Payer: Self-pay | Admitting: Internal Medicine

## 2019-11-10 DIAGNOSIS — I10 Essential (primary) hypertension: Secondary | ICD-10-CM | POA: Diagnosis not present

## 2019-11-10 DIAGNOSIS — Z1389 Encounter for screening for other disorder: Secondary | ICD-10-CM | POA: Diagnosis not present

## 2019-11-10 DIAGNOSIS — F17210 Nicotine dependence, cigarettes, uncomplicated: Secondary | ICD-10-CM

## 2019-11-10 DIAGNOSIS — Z0001 Encounter for general adult medical examination with abnormal findings: Secondary | ICD-10-CM | POA: Diagnosis not present

## 2019-11-10 DIAGNOSIS — R69 Illness, unspecified: Secondary | ICD-10-CM | POA: Diagnosis not present

## 2019-11-10 DIAGNOSIS — E785 Hyperlipidemia, unspecified: Secondary | ICD-10-CM | POA: Diagnosis not present

## 2019-11-10 DIAGNOSIS — Z1331 Encounter for screening for depression: Secondary | ICD-10-CM | POA: Diagnosis not present

## 2019-11-10 DIAGNOSIS — J410 Simple chronic bronchitis: Secondary | ICD-10-CM | POA: Diagnosis not present

## 2019-11-15 DIAGNOSIS — I10 Essential (primary) hypertension: Secondary | ICD-10-CM | POA: Diagnosis not present

## 2019-11-15 DIAGNOSIS — E785 Hyperlipidemia, unspecified: Secondary | ICD-10-CM | POA: Diagnosis not present

## 2019-11-17 ENCOUNTER — Ambulatory Visit (HOSPITAL_COMMUNITY)
Admission: RE | Admit: 2019-11-17 | Discharge: 2019-11-17 | Disposition: A | Discharge status: Home / Self Care | Payer: Medicare HMO | Source: Ambulatory Visit | Attending: Internal Medicine | Admitting: Internal Medicine

## 2019-11-17 ENCOUNTER — Other Ambulatory Visit: Payer: Self-pay

## 2019-11-17 DIAGNOSIS — Z8679 Personal history of other diseases of the circulatory system: Secondary | ICD-10-CM | POA: Diagnosis not present

## 2019-11-17 DIAGNOSIS — R69 Illness, unspecified: Secondary | ICD-10-CM | POA: Diagnosis not present

## 2019-11-17 DIAGNOSIS — F17210 Nicotine dependence, cigarettes, uncomplicated: Secondary | ICD-10-CM | POA: Insufficient documentation

## 2019-12-08 DIAGNOSIS — J410 Simple chronic bronchitis: Secondary | ICD-10-CM | POA: Diagnosis not present

## 2019-12-08 DIAGNOSIS — I10 Essential (primary) hypertension: Secondary | ICD-10-CM | POA: Diagnosis not present

## 2019-12-16 ENCOUNTER — Other Ambulatory Visit (HOSPITAL_COMMUNITY): Payer: Self-pay | Admitting: Respiratory Therapy

## 2019-12-16 DIAGNOSIS — J441 Chronic obstructive pulmonary disease with (acute) exacerbation: Secondary | ICD-10-CM

## 2020-01-04 DIAGNOSIS — I10 Essential (primary) hypertension: Secondary | ICD-10-CM | POA: Diagnosis not present

## 2020-01-04 DIAGNOSIS — Z8673 Personal history of transient ischemic attack (TIA), and cerebral infarction without residual deficits: Secondary | ICD-10-CM | POA: Diagnosis not present

## 2020-02-01 DIAGNOSIS — E785 Hyperlipidemia, unspecified: Secondary | ICD-10-CM | POA: Diagnosis not present

## 2020-02-01 DIAGNOSIS — I10 Essential (primary) hypertension: Secondary | ICD-10-CM | POA: Diagnosis not present

## 2020-03-03 DIAGNOSIS — I10 Essential (primary) hypertension: Secondary | ICD-10-CM | POA: Diagnosis not present

## 2020-03-03 DIAGNOSIS — E785 Hyperlipidemia, unspecified: Secondary | ICD-10-CM | POA: Diagnosis not present

## 2020-03-05 DIAGNOSIS — C44321 Squamous cell carcinoma of skin of nose: Secondary | ICD-10-CM | POA: Diagnosis not present

## 2020-04-02 DIAGNOSIS — E785 Hyperlipidemia, unspecified: Secondary | ICD-10-CM | POA: Diagnosis not present

## 2020-04-02 DIAGNOSIS — I10 Essential (primary) hypertension: Secondary | ICD-10-CM | POA: Diagnosis not present

## 2020-04-30 DIAGNOSIS — R69 Illness, unspecified: Secondary | ICD-10-CM | POA: Diagnosis not present

## 2020-04-30 DIAGNOSIS — J209 Acute bronchitis, unspecified: Secondary | ICD-10-CM | POA: Diagnosis not present

## 2020-05-28 DIAGNOSIS — J410 Simple chronic bronchitis: Secondary | ICD-10-CM | POA: Diagnosis not present

## 2020-05-28 DIAGNOSIS — R69 Illness, unspecified: Secondary | ICD-10-CM | POA: Diagnosis not present

## 2020-05-28 DIAGNOSIS — I10 Essential (primary) hypertension: Secondary | ICD-10-CM | POA: Diagnosis not present

## 2020-06-28 DIAGNOSIS — I10 Essential (primary) hypertension: Secondary | ICD-10-CM | POA: Diagnosis not present

## 2020-06-28 DIAGNOSIS — E785 Hyperlipidemia, unspecified: Secondary | ICD-10-CM | POA: Diagnosis not present

## 2020-07-29 DIAGNOSIS — I10 Essential (primary) hypertension: Secondary | ICD-10-CM | POA: Diagnosis not present

## 2020-07-29 DIAGNOSIS — E785 Hyperlipidemia, unspecified: Secondary | ICD-10-CM | POA: Diagnosis not present

## 2020-08-13 DIAGNOSIS — R69 Illness, unspecified: Secondary | ICD-10-CM | POA: Diagnosis not present

## 2020-08-28 DIAGNOSIS — E785 Hyperlipidemia, unspecified: Secondary | ICD-10-CM | POA: Diagnosis not present

## 2020-08-28 DIAGNOSIS — I10 Essential (primary) hypertension: Secondary | ICD-10-CM | POA: Diagnosis not present

## 2020-09-19 DIAGNOSIS — R69 Illness, unspecified: Secondary | ICD-10-CM | POA: Diagnosis not present

## 2020-09-28 DIAGNOSIS — E785 Hyperlipidemia, unspecified: Secondary | ICD-10-CM | POA: Diagnosis not present

## 2020-09-28 DIAGNOSIS — I10 Essential (primary) hypertension: Secondary | ICD-10-CM | POA: Diagnosis not present

## 2020-10-26 DIAGNOSIS — Z1389 Encounter for screening for other disorder: Secondary | ICD-10-CM | POA: Diagnosis not present

## 2020-10-26 DIAGNOSIS — R69 Illness, unspecified: Secondary | ICD-10-CM | POA: Diagnosis not present

## 2020-10-26 DIAGNOSIS — Z1331 Encounter for screening for depression: Secondary | ICD-10-CM | POA: Diagnosis not present

## 2020-10-26 DIAGNOSIS — Z0001 Encounter for general adult medical examination with abnormal findings: Secondary | ICD-10-CM | POA: Diagnosis not present

## 2020-10-26 DIAGNOSIS — Z8673 Personal history of transient ischemic attack (TIA), and cerebral infarction without residual deficits: Secondary | ICD-10-CM | POA: Diagnosis not present

## 2020-10-26 DIAGNOSIS — I10 Essential (primary) hypertension: Secondary | ICD-10-CM | POA: Diagnosis not present

## 2020-10-26 DIAGNOSIS — J410 Simple chronic bronchitis: Secondary | ICD-10-CM | POA: Diagnosis not present

## 2020-10-26 DIAGNOSIS — Z23 Encounter for immunization: Secondary | ICD-10-CM | POA: Diagnosis not present

## 2020-11-06 DIAGNOSIS — I10 Essential (primary) hypertension: Secondary | ICD-10-CM | POA: Diagnosis not present

## 2020-11-06 DIAGNOSIS — Z79899 Other long term (current) drug therapy: Secondary | ICD-10-CM | POA: Diagnosis not present

## 2020-11-06 DIAGNOSIS — Z0001 Encounter for general adult medical examination with abnormal findings: Secondary | ICD-10-CM | POA: Diagnosis not present

## 2020-11-26 DIAGNOSIS — I10 Essential (primary) hypertension: Secondary | ICD-10-CM | POA: Diagnosis not present

## 2020-11-26 DIAGNOSIS — J410 Simple chronic bronchitis: Secondary | ICD-10-CM | POA: Diagnosis not present

## 2020-12-21 ENCOUNTER — Emergency Department (HOSPITAL_COMMUNITY): Payer: Medicare HMO

## 2020-12-21 ENCOUNTER — Observation Stay (HOSPITAL_COMMUNITY)
Admission: EM | Admit: 2020-12-21 | Discharge: 2020-12-23 | Disposition: A | Discharge status: Home / Self Care | Payer: Medicare HMO | Attending: Internal Medicine | Admitting: Internal Medicine

## 2020-12-21 ENCOUNTER — Other Ambulatory Visit: Payer: Self-pay

## 2020-12-21 ENCOUNTER — Encounter (HOSPITAL_COMMUNITY): Payer: Self-pay | Admitting: Emergency Medicine

## 2020-12-21 DIAGNOSIS — Z20822 Contact with and (suspected) exposure to covid-19: Secondary | ICD-10-CM | POA: Diagnosis not present

## 2020-12-21 DIAGNOSIS — K5730 Diverticulosis of large intestine without perforation or abscess without bleeding: Secondary | ICD-10-CM | POA: Insufficient documentation

## 2020-12-21 DIAGNOSIS — D72829 Elevated white blood cell count, unspecified: Secondary | ICD-10-CM | POA: Diagnosis not present

## 2020-12-21 DIAGNOSIS — J439 Emphysema, unspecified: Secondary | ICD-10-CM | POA: Diagnosis not present

## 2020-12-21 DIAGNOSIS — I700 Atherosclerosis of aorta: Secondary | ICD-10-CM | POA: Insufficient documentation

## 2020-12-21 DIAGNOSIS — D582 Other hemoglobinopathies: Secondary | ICD-10-CM | POA: Diagnosis not present

## 2020-12-21 DIAGNOSIS — Z720 Tobacco use: Secondary | ICD-10-CM | POA: Diagnosis present

## 2020-12-21 DIAGNOSIS — Z8673 Personal history of transient ischemic attack (TIA), and cerebral infarction without residual deficits: Secondary | ICD-10-CM | POA: Diagnosis not present

## 2020-12-21 DIAGNOSIS — E872 Acidosis, unspecified: Secondary | ICD-10-CM

## 2020-12-21 DIAGNOSIS — E785 Hyperlipidemia, unspecified: Secondary | ICD-10-CM | POA: Diagnosis present

## 2020-12-21 DIAGNOSIS — F17210 Nicotine dependence, cigarettes, uncomplicated: Secondary | ICD-10-CM | POA: Diagnosis not present

## 2020-12-21 DIAGNOSIS — R61 Generalized hyperhidrosis: Secondary | ICD-10-CM | POA: Diagnosis not present

## 2020-12-21 DIAGNOSIS — I959 Hypotension, unspecified: Secondary | ICD-10-CM | POA: Diagnosis not present

## 2020-12-21 DIAGNOSIS — E871 Hypo-osmolality and hyponatremia: Secondary | ICD-10-CM | POA: Diagnosis not present

## 2020-12-21 DIAGNOSIS — I2510 Atherosclerotic heart disease of native coronary artery without angina pectoris: Secondary | ICD-10-CM | POA: Diagnosis not present

## 2020-12-21 DIAGNOSIS — E860 Dehydration: Secondary | ICD-10-CM

## 2020-12-21 DIAGNOSIS — E782 Mixed hyperlipidemia: Secondary | ICD-10-CM | POA: Diagnosis not present

## 2020-12-21 DIAGNOSIS — R55 Syncope and collapse: Secondary | ICD-10-CM

## 2020-12-21 DIAGNOSIS — R4781 Slurred speech: Secondary | ICD-10-CM | POA: Diagnosis not present

## 2020-12-21 DIAGNOSIS — Z79899 Other long term (current) drug therapy: Secondary | ICD-10-CM | POA: Insufficient documentation

## 2020-12-21 DIAGNOSIS — Z7982 Long term (current) use of aspirin: Secondary | ICD-10-CM | POA: Diagnosis not present

## 2020-12-21 DIAGNOSIS — R531 Weakness: Secondary | ICD-10-CM | POA: Diagnosis not present

## 2020-12-21 DIAGNOSIS — R69 Illness, unspecified: Secondary | ICD-10-CM | POA: Diagnosis not present

## 2020-12-21 DIAGNOSIS — N179 Acute kidney failure, unspecified: Secondary | ICD-10-CM

## 2020-12-21 DIAGNOSIS — R32 Unspecified urinary incontinence: Secondary | ICD-10-CM | POA: Diagnosis not present

## 2020-12-21 DIAGNOSIS — I10 Essential (primary) hypertension: Secondary | ICD-10-CM

## 2020-12-21 DIAGNOSIS — R739 Hyperglycemia, unspecified: Secondary | ICD-10-CM | POA: Diagnosis not present

## 2020-12-21 DIAGNOSIS — R718 Other abnormality of red blood cells: Secondary | ICD-10-CM

## 2020-12-21 LAB — COMPREHENSIVE METABOLIC PANEL
ALT: 18 U/L (ref 0–44)
AST: 23 U/L (ref 15–41)
Albumin: 4.4 g/dL (ref 3.5–5.0)
Alkaline Phosphatase: 57 U/L (ref 38–126)
Anion gap: 10 (ref 5–15)
BUN: 22 mg/dL (ref 8–23)
CO2: 27 mmol/L (ref 22–32)
Calcium: 9.8 mg/dL (ref 8.9–10.3)
Chloride: 96 mmol/L — ABNORMAL LOW (ref 98–111)
Creatinine, Ser: 1.53 mg/dL — ABNORMAL HIGH (ref 0.61–1.24)
GFR, Estimated: 48 mL/min — ABNORMAL LOW (ref >60)
Glucose, Bld: 138 mg/dL — ABNORMAL HIGH (ref 70–99)
Potassium: 4.1 mmol/L (ref 3.5–5.1)
Sodium: 133 mmol/L — ABNORMAL LOW (ref 135–145)
Total Bilirubin: 0.8 mg/dL (ref 0.3–1.2)
Total Protein: 7.3 g/dL (ref 6.5–8.1)

## 2020-12-21 LAB — CBC WITH DIFFERENTIAL/PLATELET
Abs Immature Granulocytes: 0.07 10*3/uL (ref 0.00–0.07)
Basophils Absolute: 0.1 10*3/uL (ref 0.0–0.1)
Basophils Relative: 1 %
Eosinophils Absolute: 0.2 10*3/uL (ref 0.0–0.5)
Eosinophils Relative: 2 %
HCT: 57.6 % — ABNORMAL HIGH (ref 39.0–52.0)
Hemoglobin: 18.9 g/dL — ABNORMAL HIGH (ref 13.0–17.0)
Immature Granulocytes: 1 %
Lymphocytes Relative: 25 %
Lymphs Abs: 3.2 10*3/uL (ref 0.7–4.0)
MCH: 31.3 pg (ref 26.0–34.0)
MCHC: 32.8 g/dL (ref 30.0–36.0)
MCV: 95.5 fL (ref 80.0–100.0)
Monocytes Absolute: 0.9 10*3/uL (ref 0.1–1.0)
Monocytes Relative: 7 %
Neutro Abs: 8.5 10*3/uL — ABNORMAL HIGH (ref 1.7–7.7)
Neutrophils Relative %: 64 %
Platelets: 191 10*3/uL (ref 150–400)
RBC: 6.03 MIL/uL — ABNORMAL HIGH (ref 4.22–5.81)
RDW: 13.2 % (ref 11.5–15.5)
WBC: 13 10*3/uL — ABNORMAL HIGH (ref 4.0–10.5)
nRBC: 0 % (ref 0.0–0.2)

## 2020-12-21 LAB — URINALYSIS, ROUTINE W REFLEX MICROSCOPIC
Bacteria, UA: NONE SEEN
Bilirubin Urine: NEGATIVE
Glucose, UA: NEGATIVE mg/dL
Ketones, ur: NEGATIVE mg/dL
Leukocytes,Ua: NEGATIVE
Nitrite: NEGATIVE
Protein, ur: NEGATIVE mg/dL
Specific Gravity, Urine: >1.046 — ABNORMAL HIGH (ref 1.005–1.030)
pH: 6 (ref 5.0–8.0)

## 2020-12-21 LAB — TROPONIN I (HIGH SENSITIVITY)
Troponin I (High Sensitivity): 5 ng/L (ref <18)
Troponin I (High Sensitivity): 4 ng/L (ref <18)

## 2020-12-21 LAB — PROTIME-INR
INR: 1.1 (ref 0.8–1.2)
Prothrombin Time: 13.4 seconds (ref 11.4–15.2)

## 2020-12-21 LAB — ETHANOL: Alcohol, Ethyl (B): <10 mg/dL (ref <10)

## 2020-12-21 LAB — LACTIC ACID, PLASMA
Lactic Acid, Venous: 3.2 mmol/L (ref 0.5–1.9)
Lactic Acid, Venous: 1.4 mmol/L (ref 0.5–1.9)

## 2020-12-21 LAB — APTT: aPTT: 30 seconds (ref 24–36)

## 2020-12-21 MED ORDER — HEPARIN SODIUM (PORCINE) 5000 UNIT/ML IJ SOLN
5000.0000 [IU] | Freq: Three times a day (TID) | INTRAMUSCULAR | Status: DC
  Administered 2020-12-22 – 2020-12-23 (×5): 5000 [IU] via SUBCUTANEOUS
  Filled 2020-12-21 (×4): qty 1

## 2020-12-21 MED ORDER — IOHEXOL 350 MG/ML SOLN
100.0000 mL | Freq: Once | INTRAVENOUS | Status: AC | PRN
  Administered 2020-12-21: 100 mL via INTRAVENOUS

## 2020-12-21 MED ORDER — SODIUM CHLORIDE 0.9 % IV BOLUS (SEPSIS)
2000.0000 mL | Freq: Once | INTRAVENOUS | Status: AC
  Administered 2020-12-21: 2000 mL via INTRAVENOUS

## 2020-12-21 NOTE — ED Notes (Signed)
Date and time results received: 12/21/20 2150 (use smartphrase ".now" to insert current time)  Test: Lactic Acid Critical Value: 3.2  Name of Provider Notified: Dr. Roderic Palau

## 2020-12-21 NOTE — H&P (Signed)
History and Physical  Phillip Richardson IRJ:[id number] DOB: [birth year] DOA: 12/21/2020  Referring physician: Milton Ferguson, MD PCP: Rosita Fire, MD  Patient coming from: Home  Chief Complaint: Generalized weakness  HPI: Phillip Richardson is a 75 y.o. male with medical history significant for hypertension and hyperlipidemia who presents to the emergency department via EMS due to weakness.  Patient states that he was sitting at the dining room table this evening when he suddenly became weak and presented with blank face and increased perspiration, this was associated with watery stool x1 and transitory loss of control with bowel due to the weakness.  Son brought patient to the emergency department.  ED Course: In the emergency department, BP was soft at 81/63 on arrival, work-up in the ED showed WBC 13.0, H/H 18.9/57.6, lactic acid 3.2 > 1.4, mild hyponatremia, BUN/creatinine 22/1.53 (No recent labs for comparison of baseline creatinine).  Blood glucose 138.  Troponin x2- 5 > 4.  SARS coronavirus 2 was negative.  Urinalysis was unimpressive for UTI. CT angiography chest/abdomen and pelvis showed no acute findings in the chest Chest x-ray showed slightly coarsened reticular and bronchitic features IV hydration was provided.  Hospitalist was asked to admit patient for further evaluation and management.    Review of Systems: Constitutional: Negative for chills and fever.  HENT: Negative for ear pain and sore throat.   Eyes: Negative for pain and visual disturbance.  Respiratory: Negative for cough, chest tightness and shortness of breath.   Cardiovascular: Negative for chest pain and palpitations.  Gastrointestinal: Negative for abdominal pain and vomiting.  Endocrine: Negative for polyphagia and polyuria.  Genitourinary: Negative for decreased urine volume, dysuria, enuresis Musculoskeletal: Negative for arthralgias and back pain.  Skin: Negative for color change and rash.   Allergic/Immunologic: Negative for immunocompromised state.  Neurological: Positive for weakness.  Negative for tremors, syncope, speech difficulty Hematological: Does not bruise/bleed easily.  All other systems reviewed and are negative   Past Medical History:  Diagnosis Date  . Arthritis   . TIA (transient ischemic attack) 2014   Past Surgical History:  Procedure Laterality Date  . BACK SURGERY    . COLONOSCOPY N/A 12/10/2018   Procedure: COLONOSCOPY;  Surgeon: Danie Binder, MD;  Location: [facility];  Service: Endoscopy;  Laterality: N/A;  8:30  . KNEE SURGERY Left   . POLYPECTOMY  12/10/2018   Procedure: POLYPECTOMY;  Surgeon: Danie Binder, MD;  Location: [facility];  Service: Endoscopy;;  . ROTATOR CUFF REPAIR Right     Social History:  reports that he has been smoking cigarettes. He has never used smokeless tobacco. He reports that he does not drink alcohol and does not use drugs.   No Known Allergies  No family history on file.    Prior to Admission medications   Medication Sig Start Date End Date Taking? Authorizing Provider  aspirin 81 MG chewable tablet Chew 1 tablet (81 mg total) by mouth daily. 02/24/15   Milagros Loll, MD  atorvastatin (LIPITOR) 10 MG tablet Take 10 mg by mouth daily. 09/12/18   [provider]  diphenhydramine-acetaminophen (TYLENOL PM) 25-500 MG TABS tablet Take 1 tablet by mouth at bedtime.    [provider]  ibuprofen (ADVIL,MOTRIN) 200 MG tablet Take 400 mg by mouth every 8 (eight) hours as needed (for pain.).    [provider]  lisinopril-hydrochlorothiazide (PRINZIDE,ZESTORETIC) 20-12.5 MG tablet Take 1 tablet by mouth daily. 09/12/18   [provider]  Physical Exam: BP 92/68   Pulse 66   Temp (!) 96.9 F (36.1 C) (Rectal)   Resp 16   Ht 6\' 2"  (1.88 m)   Wt 77.1 kg   SpO2 95%   BMI 21.82 kg/m   . General: 75 y.o. year-old male well developed well nourished in no acute  distress.  Alert and oriented x3. Marland Kitchen HEENT: Dry mucous membrane.  NCAT, EOMI . Neck: Supple, trachea medial . Cardiovascular: Regular rate and rhythm with no rubs or gallops.  No thyromegaly or JVD noted.  2/4 pulses in all 4 extremities. Marland Kitchen Respiratory: Clear to auscultation with no wheezes or rales. Good inspiratory effort. . Abdomen: Soft nontender nondistended with normal bowel sounds x4 quadrants. . Muskuloskeletal: No cyanosis, clubbing or edema noted bilaterally . Neuro: CN II-XII intact, strength 5/5 x 4, sensation, reflexes intact . Skin: No ulcerative lesions noted or rashes . Psychiatry: Judgement and insight appear normal. Mood is appropriate for condition and setting          Labs on Admission:  Basic Metabolic Panel: Recent Labs  Lab 12/21/20 2029  NA 133*  K 4.1  CL 96*  CO2 27  GLUCOSE 138*  BUN 22  CREATININE 1.53*  CALCIUM 9.8   Liver Function Tests: Recent Labs  Lab 12/21/20 2029  AST 23  ALT 18  ALKPHOS 57  BILITOT 0.8  PROT 7.3  ALBUMIN 4.4   No results for input(s): LIPASE, AMYLASE in the last 168 hours. No results for input(s): AMMONIA in the last 168 hours. CBC: Recent Labs  Lab 12/21/20 2029  WBC 13.0*  NEUTROABS 8.5*  HGB 18.9*  HCT 57.6*  MCV 95.5  PLT 191   Cardiac Enzymes: No results for input(s): CKTOTAL, CKMB, CKMBINDEX, TROPONINI in the last 168 hours.  BNP (last 3 results) No results for input(s): BNP in the last 8760 hours.  ProBNP (last 3 results) No results for input(s): PROBNP in the last 8760 hours.  CBG: No results for input(s): GLUCAP in the last 168 hours.  Radiological Exams on Admission: DG Chest Port 1 View  Result Date: 12/21/2020 CLINICAL DATA:  Sudden weakness, hypotensive, diaphoresis, current smoker, testing for COVID 19 EXAM: PORTABLE CHEST 1 VIEW COMPARISON:  CT 12/21/2020 FINDINGS: Coarsened reticular changes and bronchitic features are seen throughout the chest, slightly increased from prior imaging.  Biapical pleuroparenchymal scarring is similar to comparison. No pneumothorax. No effusion. The aorta is calcified. The remaining cardiomediastinal contours are unremarkable. No acute osseous abnormality or suspicious osseous lesion. Degenerative changes are present in the imaged spine and shoulders. Telemetry leads overlie the chest. IMPRESSION: Slightly coarsened reticular and bronchitic features. No consolidative process or features of edema. Aortic Atherosclerosis (ICD10-I70.0). Electronically Signed   By: Lovena Le M.D.   On: 12/21/2020 20:33   CT Angio Chest/Abd/Pel for Dissection W and/or Wo Contrast  Result Date: 12/21/2020 CLINICAL DATA:  Pt was sitting at dinner when all of sudden became weak, incontinence, slurred speech, diaphoretic. BP 60/52Abdominal pain, aortic dissection suspected EXAM: CT ANGIOGRAPHY CHEST, ABDOMEN AND PELVIS TECHNIQUE: Non-contrast CT of the chest was initially obtained. Multidetector CT imaging through the chest, abdomen and pelvis was performed using the standard protocol during bolus administration of intravenous contrast. Multiplanar reconstructed images and MIPs were obtained and reviewed to evaluate the vascular anatomy. CONTRAST:  163mL OMNIPAQUE IOHEXOL 350 MG/ML SOLN COMPARISON:  None. FINDINGS: CTA CHEST FINDINGS Cardiovascular: No aortic dissection. No aortic aneurysm. Aortic atherosclerosis without stenosis. There are atherosclerotic changes the  arch branch vessels with no significant stenosis. Central pulmonary arteries are well opacified. No evidence of a central pulmonary embolism. Heart is normal in size and configuration. No pericardial effusion. Three-vessel coronary artery calcifications. Mediastinum/Nodes: No enlarged mediastinal, hilar, or axillary lymph nodes. Thyroid gland, trachea, and esophagus demonstrate no significant findings. Lungs/Pleura: No pneumonia or pulmonary edema. Mild to moderate centrilobular emphysema. Apical pleuroparenchymal  scarring. Small irregular nodular opacity in the right upper lobe, image 81, series 8, 3 mm. 3 mm similar appearing opacity in the more inferior right upper lobe, image 90. No other nodules. No masses. No pleural effusion or pneumothorax. Musculoskeletal: No fracture or acute finding. No bone lesion. No chest wall masses. Review of the MIP images confirms the above findings. CTA ABDOMEN AND PELVIS FINDINGS VASCULAR Aorta: Atherosclerotic disease. No significant stenosis. No aneurysm or dissection. Celiac: Patent without evidence of aneurysm, dissection, vasculitis or significant stenosis. SMA: Patent without evidence of aneurysm, dissection, vasculitis or significant stenosis. Renals: Mild atherosclerotic change at the origin of the renal arteries, left greater than right without significant stenosis. No evidence of aneurysm, dissection or vasculitis. IMA: Small inferior mesenteric artery. Inflow: Diffuse atherosclerotic change along the common, external and internal iliac arteries. No significant stenosis. Veins: No obvious venous abnormality within the limitations of this arterial phase study. Review of the MIP images confirms the above findings. NON-VASCULAR Hepatobiliary: Normal in size and overall attenuation. Low-density lesion along the posterior aspect of segment 6, 1 cm, consistent with a cyst. No other masses or lesions. Normal gallbladder. No bile duct dilation. Pancreas: Unremarkable. No pancreatic ductal dilatation or surrounding inflammatory changes. Spleen: Normal in size without focal abnormality. Adrenals/Urinary Tract: Left adrenal mass, 1.7 cm, most likely an adenoma. Small right adrenal mass versus nodular hyperplasia. Kidneys normal in size, orientation and position. No masses, stones or hydronephrosis. Normal ureters. Bladder minimally distended, otherwise unremarkable. Stomach/Bowel: Stomach moderately distended, otherwise unremarkable. Diverticulum from the second portion of the duodenum  indents the pancreatic head lying just posterior to the distal common bile duct. Prominent enhancement the proximal to mid small bowel, no convincing wall thickening. Colon is normal in caliber. No wall thickening or inflammation. Numerous left colon diverticula mostly along the sigmoid. No evidence of diverticulitis. Appendix is prominent, 7 mm in diameter, but shows no wall thickening and contains internal air. This is felt be a normal variant for this patient. Lymphatic: No enlarged lymph nodes. Reproductive: Mild prostate enlargement, 4.8 x 4.0 cm transversely. Other: Small amount of ascites collecting along the anterior margin of the liver, more notably adjacent to the spleen and in the posterior pelvic recess. Musculoskeletal: No fracture or acute finding.  No bone lesion. Review of the MIP images confirms the above findings. IMPRESSION: CTA FINDINGS 1. No aortic dissection or aneurysm. 2. No acute findings. 3. Aortic and branch vessel atherosclerosis. No significant stenosis. NON CTA FINDINGS 1. No acute findings in the chest. 2. Three-vessel coronary artery calcifications. 3. Moderate emphysema. 4. 2 small lung nodules on the right. No follow-up needed if patient is low-risk (and has no known or suspected primary neoplasm). Non-contrast chest CT can be considered in 12 months if patient is high-risk. This recommendation follows the consensus statement: Guidelines for Management of Incidental Pulmonary Nodules Detected on CT Images: From the Fleischner Society 2017; Radiology 2017; 284:228-243. 5. Abnormal appearance of the small bowel with significant wall enhancement. This is nonspecific, but consider small-bowel infection or inflammation in the proper clinical setting. There is no convincing wall thickening, however.  6. Small amount of ascites of unclear etiology. 7. Adrenal masses, left greater than right, most likely adenomas. 8. Left colon diverticulosis without evidence of diverticulitis.  Electronically Signed   By: Lajean Manes M.D.   On: 12/21/2020 20:49    EKG: I independently viewed the EKG done and my findings are as followed: Normal sinus rhythm at a rate of 74 bpm  Assessment/Plan Present on Admission: . Hypotension . Hyperlipidemia  Principal Problem:   Hypotension Active Problems:   Hyperglycemia   Hyperlipidemia   Dehydration   Essential hypertension   Leukocytosis   Lactic acidosis   AKI (acute kidney injury) ([place])   Elevated hemoglobin (HCC)   Elevated hematocrit  Hypotension Dehydration IV hydration was provided, continue IV hydration Orthostatic BP will be checked Continue fall precaution and neurochecks  Lactic acidosis-resolved Lactic acid 3.2 > 1.4; this resolved after IV hydration  Elevated hemoglobin/hematocrit possibly due to hemoconcentration H/H 18.9/57.6.  IV hydration was provided Continue to monitor H/H with morning labs  Leukocytosis possibly reactive WBC 13.0, no obvious sign of an acute infectious process at this time, patient had 1 loose bowel movement during onset of symptoms, but this has since resolved and patient endorsed being back to his baseline. Plan to monitor WBC with low levels  Hyperglycemia possibly reactive Blood glucose 138, continue to monitor with morning labs  Essential hypertension BP meds will be held at this time due to soft BP  Hyperlipidemia Continue Lipitor  DVT prophylaxis: Continue heparin  Code Status: Full code  Family Communication: None at bedside  Disposition Plan:  Patient is from:                        home Anticipated DC to:                   SNF or family members home Anticipated DC date:               2-3 days Anticipated DC barriers:           Patient requires inpatient management for optimization of BP and rehydration at this time  Consults called: None  Admission status: Observation  Bernadette Hoit MD [facility]  12/22/2020, 3:45 AM

## 2020-12-21 NOTE — ED Notes (Signed)
Patient transported to CT via monitored stretcher accompanied by this nurse and radiology staff.

## 2020-12-21 NOTE — H&P (Incomplete)
History and Physical  Phillip Richardson:[id number] DOB: 07-18-1946 DOA: 12/21/2020  Referring physician: ***  PCP: Rosita Fire, MD  Outpatient Specialists: *** Patient coming from: *** & is able to ambulate ***  Chief Complaint: ***   HPI: DEMARR Richardson is a 75 y.o. male with medical history significant for *** (For level 3, the HPI must include 4+ descriptors: Location, Quality, Severity, Duration, Timing, Context, modifying factors, associated signs/symptoms and/or status of 3+ chronic problems.)   ED Course: ***  Review of Systems: Review of systems as noted in the HPI. All other systems reviewed and are negative.   Past Medical History:  Diagnosis Date  . Arthritis   . TIA (transient ischemic attack) 2014   Past Surgical History:  Procedure Laterality Date  . BACK SURGERY    . COLONOSCOPY N/A 12/10/2018   Procedure: COLONOSCOPY;  Surgeon: Danie Binder, MD;  Location: [facility];  Service: Endoscopy;  Laterality: N/A;  8:30  . KNEE SURGERY Left   . POLYPECTOMY  12/10/2018   Procedure: POLYPECTOMY;  Surgeon: Danie Binder, MD;  Location: [facility];  Service: Endoscopy;;  . ROTATOR CUFF REPAIR Right     Social History:  reports that he has been smoking cigarettes. He has never used smokeless tobacco. He reports that he does not drink alcohol and does not use drugs.   No Known Allergies  No family history on file.  ***  Prior to Admission medications   Medication Sig Start Date End Date Taking? Authorizing Provider  aspirin 81 MG chewable tablet Chew 1 tablet (81 mg total) by mouth daily. 02/24/15   Milagros Loll, MD  atorvastatin (LIPITOR) 10 MG tablet Take 10 mg by mouth daily. 09/12/18   [provider]  diphenhydramine-acetaminophen (TYLENOL PM) 25-500 MG TABS tablet Take 1 tablet by mouth at bedtime.    [provider]  ibuprofen (ADVIL,MOTRIN) 200 MG tablet Take 400 mg by mouth every 8 (eight) hours as needed (for pain.).     [provider]  lisinopril-hydrochlorothiazide (PRINZIDE,ZESTORETIC) 20-12.5 MG tablet Take 1 tablet by mouth daily. 09/12/18   [provider]    Physical Exam: BP 106/72   Pulse 65   Temp (!) 96.9 F (36.1 C) (Rectal)   Resp 12   Ht 6\' 2"  (1.88 m)   Wt 77.1 kg   SpO2 95%   BMI 21.82 kg/m   . General: 75 y.o. year-old male well developed well nourished in no acute distress.  Alert and oriented x3. . Cardiovascular: Regular rate and rhythm with no rubs or gallops.  No thyromegaly or JVD noted.  No lower extremity edema. 2/4 pulses in all 4 extremities. Marland Kitchen Respiratory: Clear to auscultation with no wheezes or rales. Good inspiratory effort. . Abdomen: Soft nontender nondistended with normal bowel sounds x4 quadrants. . Muskuloskeletal: No cyanosis, clubbing or edema noted bilaterally . Neuro: CN II-XII intact, strength, sensation, reflexes . Skin: No ulcerative lesions noted or rashes . Psychiatry: Judgement and insight appear normal. Mood is appropriate for condition and setting          Labs on Admission:  Basic Metabolic Panel: Recent Labs  Lab 12/21/20 2029  NA 133*  K 4.1  CL 96*  CO2 27  GLUCOSE 138*  BUN 22  CREATININE 1.53*  CALCIUM 9.8   Liver Function Tests: Recent Labs  Lab 12/21/20 2029  AST 23  ALT 18  ALKPHOS 57  BILITOT 0.8  PROT 7.3  ALBUMIN  4.4   No results for input(s): LIPASE, AMYLASE in the last 168 hours. No results for input(s): AMMONIA in the last 168 hours. CBC: Recent Labs  Lab 12/21/20 2029  WBC 13.0*  NEUTROABS 8.5*  HGB 18.9*  HCT 57.6*  MCV 95.5  PLT 191   Cardiac Enzymes: No results for input(s): CKTOTAL, CKMB, CKMBINDEX, TROPONINI in the last 168 hours.  BNP (last 3 results) No results for input(s): BNP in the last 8760 hours.  ProBNP (last 3 results) No results for input(s): PROBNP in the last 8760 hours.  CBG: No results for input(s): GLUCAP in the last 168 hours.  Radiological Exams on  Admission: DG Chest Port 1 View  Result Date: 12/21/2020 CLINICAL DATA:  Sudden weakness, hypotensive, diaphoresis, current smoker, testing for COVID 19 EXAM: PORTABLE CHEST 1 VIEW COMPARISON:  CT 12/21/2020 FINDINGS: Coarsened reticular changes and bronchitic features are seen throughout the chest, slightly increased from prior imaging. Biapical pleuroparenchymal scarring is similar to comparison. No pneumothorax. No effusion. The aorta is calcified. The remaining cardiomediastinal contours are unremarkable. No acute osseous abnormality or suspicious osseous lesion. Degenerative changes are present in the imaged spine and shoulders. Telemetry leads overlie the chest. IMPRESSION: Slightly coarsened reticular and bronchitic features. No consolidative process or features of edema. Aortic Atherosclerosis (ICD10-I70.0). Electronically Signed   By: Lovena Le M.D.   On: 12/21/2020 20:33   CT Angio Chest/Abd/Pel for Dissection W and/or Wo Contrast  Result Date: 12/21/2020 CLINICAL DATA:  Pt was sitting at dinner when all of sudden became weak, incontinence, slurred speech, diaphoretic. BP 60/52Abdominal pain, aortic dissection suspected EXAM: CT ANGIOGRAPHY CHEST, ABDOMEN AND PELVIS TECHNIQUE: Non-contrast CT of the chest was initially obtained. Multidetector CT imaging through the chest, abdomen and pelvis was performed using the standard protocol during bolus administration of intravenous contrast. Multiplanar reconstructed images and MIPs were obtained and reviewed to evaluate the vascular anatomy. CONTRAST:  147mL OMNIPAQUE IOHEXOL 350 MG/ML SOLN COMPARISON:  None. FINDINGS: CTA CHEST FINDINGS Cardiovascular: No aortic dissection. No aortic aneurysm. Aortic atherosclerosis without stenosis. There are atherosclerotic changes the arch branch vessels with no significant stenosis. Central pulmonary arteries are well opacified. No evidence of a central pulmonary embolism. Heart is normal in size and configuration.  No pericardial effusion. Three-vessel coronary artery calcifications. Mediastinum/Nodes: No enlarged mediastinal, hilar, or axillary lymph nodes. Thyroid gland, trachea, and esophagus demonstrate no significant findings. Lungs/Pleura: No pneumonia or pulmonary edema. Mild to moderate centrilobular emphysema. Apical pleuroparenchymal scarring. Small irregular nodular opacity in the right upper lobe, image 81, series 8, 3 mm. 3 mm similar appearing opacity in the more inferior right upper lobe, image 90. No other nodules. No masses. No pleural effusion or pneumothorax. Musculoskeletal: No fracture or acute finding. No bone lesion. No chest wall masses. Review of the MIP images confirms the above findings. CTA ABDOMEN AND PELVIS FINDINGS VASCULAR Aorta: Atherosclerotic disease. No significant stenosis. No aneurysm or dissection. Celiac: Patent without evidence of aneurysm, dissection, vasculitis or significant stenosis. SMA: Patent without evidence of aneurysm, dissection, vasculitis or significant stenosis. Renals: Mild atherosclerotic change at the origin of the renal arteries, left greater than right without significant stenosis. No evidence of aneurysm, dissection or vasculitis. IMA: Small inferior mesenteric artery. Inflow: Diffuse atherosclerotic change along the common, external and internal iliac arteries. No significant stenosis. Veins: No obvious venous abnormality within the limitations of this arterial phase study. Review of the MIP images confirms the above findings. NON-VASCULAR Hepatobiliary: Normal in size and overall attenuation. Low-density  lesion along the posterior aspect of segment 6, 1 cm, consistent with a cyst. No other masses or lesions. Normal gallbladder. No bile duct dilation. Pancreas: Unremarkable. No pancreatic ductal dilatation or surrounding inflammatory changes. Spleen: Normal in size without focal abnormality. Adrenals/Urinary Tract: Left adrenal mass, 1.7 cm, most likely an adenoma.  Small right adrenal mass versus nodular hyperplasia. Kidneys normal in size, orientation and position. No masses, stones or hydronephrosis. Normal ureters. Bladder minimally distended, otherwise unremarkable. Stomach/Bowel: Stomach moderately distended, otherwise unremarkable. Diverticulum from the second portion of the duodenum indents the pancreatic head lying just posterior to the distal common bile duct. Prominent enhancement the proximal to mid small bowel, no convincing wall thickening. Colon is normal in caliber. No wall thickening or inflammation. Numerous left colon diverticula mostly along the sigmoid. No evidence of diverticulitis. Appendix is prominent, 7 mm in diameter, but shows no wall thickening and contains internal air. This is felt be a normal variant for this patient. Lymphatic: No enlarged lymph nodes. Reproductive: Mild prostate enlargement, 4.8 x 4.0 cm transversely. Other: Small amount of ascites collecting along the anterior margin of the liver, more notably adjacent to the spleen and in the posterior pelvic recess. Musculoskeletal: No fracture or acute finding.  No bone lesion. Review of the MIP images confirms the above findings. IMPRESSION: CTA FINDINGS 1. No aortic dissection or aneurysm. 2. No acute findings. 3. Aortic and branch vessel atherosclerosis. No significant stenosis. NON CTA FINDINGS 1. No acute findings in the chest. 2. Three-vessel coronary artery calcifications. 3. Moderate emphysema. 4. 2 small lung nodules on the right. No follow-up needed if patient is low-risk (and has no known or suspected primary neoplasm). Non-contrast chest CT can be considered in 12 months if patient is high-risk. This recommendation follows the consensus statement: Guidelines for Management of Incidental Pulmonary Nodules Detected on CT Images: From the Fleischner Society 2017; Radiology 2017; 284:228-243. 5. Abnormal appearance of the small bowel with significant wall enhancement. This is  nonspecific, but consider small-bowel infection or inflammation in the proper clinical setting. There is no convincing wall thickening, however. 6. Small amount of ascites of unclear etiology. 7. Adrenal masses, left greater than right, most likely adenomas. 8. Left colon diverticulosis without evidence of diverticulitis. Electronically Signed   By: Lajean Manes M.D.   On: 12/21/2020 20:49    EKG: I independently viewed the EKG done and my findings are as followed: ***   Assessment/Plan Present on Admission: . Hypotension  Active Problems:   Hypotension   DVT prophylaxis: ***   Code Status: ***   Family Communication: ***   Disposition Plan: ***   Consults called: ***   Admission status: ***     Bernadette Hoit MD [facility] Pager [phone]  If 7PM-7AM, please contact night-coverage [email or web address] Password [facility]  12/21/2020, 11:13 PM

## 2020-12-21 NOTE — ED Triage Notes (Signed)
Pt was sitting at dinner when all of sudden became weak, incontinence, slurred speech, diaphoretic.  BP 60/52

## 2020-12-21 NOTE — ED Provider Notes (Addendum)
[facility] EMERGENCY DEPARTMENT Provider Note   CSN: [id number] Arrival date & time: 12/21/20  1937     History Chief Complaint  Patient presents with  . Weakness    Phillip URSIN is a 75 y.o. male.  Patient had episode where he was sitting at the dining room table became weak and kind of blank faced and perspire all over and lost control with balance.  His son brought him to the emergency department and it took about 20 minutes.  When he arrived he was hypotensive with systolic pressure of 60  The history is provided by the patient and medical records. No language interpreter was used.  Weakness Severity:  Moderate Onset quality:  Sudden Timing:  Constant Progression:  Unchanged Chronicity:  New Context: not alcohol use   Relieved by:  Nothing Worsened by:  Nothing Ineffective treatments:  None tried Associated symptoms: no abdominal pain, no chest pain, no cough, no diarrhea, no frequency, no headaches and no seizures        Past Medical History:  Diagnosis Date  . Arthritis   . TIA (transient ischemic attack) 2014    Patient Active Problem List   Diagnosis Date Noted  . Special screening for malignant neoplasms, colon   . Hyperlipidemia 02/24/2015  . TIA (transient ischemic attack) 02/23/2015  . Tobacco use 02/23/2015  . Thrombocytopenia ([place]) 02/23/2015  . Elevated serum glucose 02/23/2015    Past Surgical History:  Procedure Laterality Date  . BACK SURGERY    . COLONOSCOPY N/A 12/10/2018   Procedure: COLONOSCOPY;  Surgeon: Danie Binder, MD;  Location: [facility];  Service: Endoscopy;  Laterality: N/A;  8:30  . KNEE SURGERY Left   . POLYPECTOMY  12/10/2018   Procedure: POLYPECTOMY;  Surgeon: Danie Binder, MD;  Location: [facility];  Service: Endoscopy;;  . ROTATOR CUFF REPAIR Right        No family history on file.  Social History   Tobacco Use  . Smoking status: Current Every Day Smoker    Types: Cigarettes  . Smokeless tobacco:  Never Used  Vaping Use  . Vaping Use: Never used  Substance Use Topics  . Alcohol use: No  . Drug use: No    Home Medications Prior to Admission medications   Medication Sig Start Date End Date Taking? Authorizing Provider  aspirin 81 MG chewable tablet Chew 1 tablet (81 mg total) by mouth daily. 02/24/15   Milagros Loll, MD  atorvastatin (LIPITOR) 10 MG tablet Take 10 mg by mouth daily. 09/12/18   [provider]  diphenhydramine-acetaminophen (TYLENOL PM) 25-500 MG TABS tablet Take 1 tablet by mouth at bedtime.    [provider]  ibuprofen (ADVIL,MOTRIN) 200 MG tablet Take 400 mg by mouth every 8 (eight) hours as needed (for pain.).    [provider]  lisinopril-hydrochlorothiazide (PRINZIDE,ZESTORETIC) 20-12.5 MG tablet Take 1 tablet by mouth daily. 09/12/18   [provider]    Allergies    Patient has no known allergies.  Review of Systems   Review of Systems  Constitutional: Negative for appetite change and fatigue.  HENT: Negative for congestion, ear discharge and sinus pressure.   Eyes: Negative for discharge.  Respiratory: Negative for cough.   Cardiovascular: Negative for chest pain.  Gastrointestinal: Negative for abdominal pain and diarrhea.  Genitourinary: Negative for frequency and hematuria.  Musculoskeletal: Negative for back pain.  Skin: Negative for rash.  Neurological: Positive for weakness. Negative for seizures and headaches.  Psychiatric/Behavioral: Negative for hallucinations.    Physical Exam Updated Vital Signs BP 122/78   Pulse 71   Temp (!) 96.9 F (36.1 C) (Rectal)   Resp 12   Ht 6\' 2"  (1.88 m)   Wt 77.1 kg   SpO2 98%   BMI 21.82 kg/m   Physical Exam Vitals and nursing note reviewed.  Constitutional:      Appearance: He is well-developed.  HENT:     Head: Normocephalic.     Nose: Nose normal.  Eyes:     General: No scleral icterus.    Extraocular Movements: EOM normal.     Conjunctiva/sclera:  Conjunctivae normal.  Neck:     Thyroid: No thyromegaly.  Cardiovascular:     Rate and Rhythm: Normal rate and regular rhythm.     Heart sounds: No murmur heard. No friction rub. No gallop.   Pulmonary:     Breath sounds: No stridor. No wheezing or rales.  Chest:     Chest wall: No tenderness.  Abdominal:     General: There is no distension.     Tenderness: There is no abdominal tenderness. There is no rebound.  Musculoskeletal:        General: No edema. Normal range of motion.     Cervical back: Neck supple.  Lymphadenopathy:     Cervical: No cervical adenopathy.  Skin:    Findings: No erythema or rash.  Neurological:     Mental Status: He is alert and oriented to person, place, and time.     Motor: No abnormal muscle tone.     Coordination: Coordination normal.  Psychiatric:        Mood and Affect: Mood and affect normal.        Behavior: Behavior normal.     ED Results / Procedures / Treatments   Labs (all labs ordered are listed, but only abnormal results are displayed) Labs Reviewed  LACTIC ACID, PLASMA - Abnormal; Notable for the following components:      Result Value   Lactic Acid, Venous 3.2 (*)    All other components within normal limits  COMPREHENSIVE METABOLIC PANEL - Abnormal; Notable for the following components:   Sodium 133 (*)    Chloride 96 (*)    Glucose, Bld 138 (*)    Creatinine, Ser 1.53 (*)    GFR, Estimated 48 (*)    All other components within normal limits  CBC WITH DIFFERENTIAL/PLATELET - Abnormal; Notable for the following components:   WBC 13.0 (*)    RBC 6.03 (*)    Hemoglobin 18.9 (*)    HCT 57.6 (*)    Neutro Abs 8.5 (*)    All other components within normal limits  CULTURE, BLOOD (SINGLE)  URINE CULTURE  RESP PANEL BY RT-PCR (FLU A&B, COVID) ARPGX2  LACTIC ACID, PLASMA  PROTIME-INR  APTT  ETHANOL  URINALYSIS, ROUTINE W REFLEX MICROSCOPIC  TROPONIN I (HIGH SENSITIVITY)  TROPONIN I (HIGH SENSITIVITY)     EKG None  Radiology DG Chest Port 1 View  Result Date: 12/21/2020 CLINICAL DATA:  Sudden weakness, hypotensive, diaphoresis, current smoker, testing for COVID 19 EXAM: PORTABLE CHEST 1 VIEW COMPARISON:  CT 12/21/2020 FINDINGS: Coarsened reticular changes and bronchitic features are seen throughout the chest, slightly increased from prior imaging. Biapical pleuroparenchymal scarring is similar to comparison. No pneumothorax. No effusion. The aorta is calcified. The remaining cardiomediastinal contours are unremarkable. No acute osseous abnormality or suspicious osseous lesion. Degenerative changes are present in the imaged spine  and shoulders. Telemetry leads overlie the chest. IMPRESSION: Slightly coarsened reticular and bronchitic features. No consolidative process or features of edema. Aortic Atherosclerosis (ICD10-I70.0). Electronically Signed   By: Lovena Le M.D.   On: 12/21/2020 20:33   CT Angio Chest/Abd/Pel for Dissection W and/or Wo Contrast  Result Date: 12/21/2020 CLINICAL DATA:  Pt was sitting at dinner when all of sudden became weak, incontinence, slurred speech, diaphoretic. BP 60/52Abdominal pain, aortic dissection suspected EXAM: CT ANGIOGRAPHY CHEST, ABDOMEN AND PELVIS TECHNIQUE: Non-contrast CT of the chest was initially obtained. Multidetector CT imaging through the chest, abdomen and pelvis was performed using the standard protocol during bolus administration of intravenous contrast. Multiplanar reconstructed images and MIPs were obtained and reviewed to evaluate the vascular anatomy. CONTRAST:  133mL OMNIPAQUE IOHEXOL 350 MG/ML SOLN COMPARISON:  None. FINDINGS: CTA CHEST FINDINGS Cardiovascular: No aortic dissection. No aortic aneurysm. Aortic atherosclerosis without stenosis. There are atherosclerotic changes the arch branch vessels with no significant stenosis. Central pulmonary arteries are well opacified. No evidence of a central pulmonary embolism. Heart is normal in size  and configuration. No pericardial effusion. Three-vessel coronary artery calcifications. Mediastinum/Nodes: No enlarged mediastinal, hilar, or axillary lymph nodes. Thyroid gland, trachea, and esophagus demonstrate no significant findings. Lungs/Pleura: No pneumonia or pulmonary edema. Mild to moderate centrilobular emphysema. Apical pleuroparenchymal scarring. Small irregular nodular opacity in the right upper lobe, image 81, series 8, 3 mm. 3 mm similar appearing opacity in the more inferior right upper lobe, image 90. No other nodules. No masses. No pleural effusion or pneumothorax. Musculoskeletal: No fracture or acute finding. No bone lesion. No chest wall masses. Review of the MIP images confirms the above findings. CTA ABDOMEN AND PELVIS FINDINGS VASCULAR Aorta: Atherosclerotic disease. No significant stenosis. No aneurysm or dissection. Celiac: Patent without evidence of aneurysm, dissection, vasculitis or significant stenosis. SMA: Patent without evidence of aneurysm, dissection, vasculitis or significant stenosis. Renals: Mild atherosclerotic change at the origin of the renal arteries, left greater than right without significant stenosis. No evidence of aneurysm, dissection or vasculitis. IMA: Small inferior mesenteric artery. Inflow: Diffuse atherosclerotic change along the common, external and internal iliac arteries. No significant stenosis. Veins: No obvious venous abnormality within the limitations of this arterial phase study. Review of the MIP images confirms the above findings. NON-VASCULAR Hepatobiliary: Normal in size and overall attenuation. Low-density lesion along the posterior aspect of segment 6, 1 cm, consistent with a cyst. No other masses or lesions. Normal gallbladder. No bile duct dilation. Pancreas: Unremarkable. No pancreatic ductal dilatation or surrounding inflammatory changes. Spleen: Normal in size without focal abnormality. Adrenals/Urinary Tract: Left adrenal mass, 1.7 cm, most  likely an adenoma. Small right adrenal mass versus nodular hyperplasia. Kidneys normal in size, orientation and position. No masses, stones or hydronephrosis. Normal ureters. Bladder minimally distended, otherwise unremarkable. Stomach/Bowel: Stomach moderately distended, otherwise unremarkable. Diverticulum from the second portion of the duodenum indents the pancreatic head lying just posterior to the distal common bile duct. Prominent enhancement the proximal to mid small bowel, no convincing wall thickening. Colon is normal in caliber. No wall thickening or inflammation. Numerous left colon diverticula mostly along the sigmoid. No evidence of diverticulitis. Appendix is prominent, 7 mm in diameter, but shows no wall thickening and contains internal air. This is felt be a normal variant for this patient. Lymphatic: No enlarged lymph nodes. Reproductive: Mild prostate enlargement, 4.8 x 4.0 cm transversely. Other: Small amount of ascites collecting along the anterior margin of the liver, more notably adjacent to the  spleen and in the posterior pelvic recess. Musculoskeletal: No fracture or acute finding.  No bone lesion. Review of the MIP images confirms the above findings. IMPRESSION: CTA FINDINGS 1. No aortic dissection or aneurysm. 2. No acute findings. 3. Aortic and branch vessel atherosclerosis. No significant stenosis. NON CTA FINDINGS 1. No acute findings in the chest. 2. Three-vessel coronary artery calcifications. 3. Moderate emphysema. 4. 2 small lung nodules on the right. No follow-up needed if patient is low-risk (and has no known or suspected primary neoplasm). Non-contrast chest CT can be considered in 12 months if patient is high-risk. This recommendation follows the consensus statement: Guidelines for Management of Incidental Pulmonary Nodules Detected on CT Images: From the Fleischner Society 2017; Radiology 2017; 284:228-243. 5. Abnormal appearance of the small bowel with significant wall  enhancement. This is nonspecific, but consider small-bowel infection or inflammation in the proper clinical setting. There is no convincing wall thickening, however. 6. Small amount of ascites of unclear etiology. 7. Adrenal masses, left greater than right, most likely adenomas. 8. Left colon diverticulosis without evidence of diverticulitis. Electronically Signed   By: Lajean Manes M.D.   On: 12/21/2020 20:49    Procedures Procedures   Medications Ordered in ED Medications  sodium chloride 0.9 % bolus 2,000 mL (0 mLs Intravenous Stopped 12/21/20 2138)  iohexol (OMNIPAQUE) 350 MG/ML injection 100 mL (100 mLs Intravenous Contrast Given 12/21/20 2025)   CRITICAL CARE Performed by: Milton Ferguson Total critical care time: 35  minutes Critical care time was exclusive of separately billable procedures and treating other patients. Critical care was necessary to treat or prevent imminent or life-threatening deterioration. Critical care was time spent personally by me on the following activities: development of treatment plan with patient and/or surrogate as well as nursing, discussions with consultants, evaluation of patient's response to treatment, examination of patient, obtaining history from patient or surrogate, ordering and performing treatments and interventions, ordering and review of laboratory studies, ordering and review of radiographic studies, pulse oximetry and re-evaluation of patient's condition.  ED Course  I have reviewed the triage vital signs and the nursing notes.  Pertinent labs & imaging results that were available during my care of the patient were reviewed by me and considered in my medical decision making (see chart for details).    MDM Rules/Calculators/A&P                          Near syncopal episode with hypotension.  Patient had persistent hypotension for at least 20 minutes but he did respond to IV fluids.  Patient will be admitted to medicine Final Clinical  Impression(s) / ED Diagnoses Final diagnoses:  Syncope, unspecified syncope type    Rx / DC Orders ED Discharge Orders    None       Milton Ferguson, MD 12/23/20 1007    Milton Ferguson, MD 12/23/20 1009

## 2020-12-22 ENCOUNTER — Observation Stay (HOSPITAL_BASED_OUTPATIENT_CLINIC_OR_DEPARTMENT_OTHER): Payer: Medicare HMO

## 2020-12-22 DIAGNOSIS — D582 Other hemoglobinopathies: Secondary | ICD-10-CM

## 2020-12-22 DIAGNOSIS — D72829 Elevated white blood cell count, unspecified: Secondary | ICD-10-CM

## 2020-12-22 DIAGNOSIS — R718 Other abnormality of red blood cells: Secondary | ICD-10-CM

## 2020-12-22 DIAGNOSIS — E861 Hypovolemia: Secondary | ICD-10-CM

## 2020-12-22 DIAGNOSIS — E872 Acidosis, unspecified: Secondary | ICD-10-CM

## 2020-12-22 DIAGNOSIS — E871 Hypo-osmolality and hyponatremia: Secondary | ICD-10-CM

## 2020-12-22 DIAGNOSIS — R0600 Dyspnea, unspecified: Secondary | ICD-10-CM | POA: Diagnosis not present

## 2020-12-22 DIAGNOSIS — I10 Essential (primary) hypertension: Secondary | ICD-10-CM

## 2020-12-22 DIAGNOSIS — E860 Dehydration: Secondary | ICD-10-CM

## 2020-12-22 DIAGNOSIS — I9589 Other hypotension: Secondary | ICD-10-CM | POA: Diagnosis not present

## 2020-12-22 DIAGNOSIS — N179 Acute kidney failure, unspecified: Secondary | ICD-10-CM

## 2020-12-22 DIAGNOSIS — Z720 Tobacco use: Secondary | ICD-10-CM | POA: Diagnosis not present

## 2020-12-22 DIAGNOSIS — R55 Syncope and collapse: Secondary | ICD-10-CM | POA: Diagnosis not present

## 2020-12-22 LAB — COMPREHENSIVE METABOLIC PANEL
ALT: 15 U/L (ref 0–44)
AST: 17 U/L (ref 15–41)
Albumin: 3.5 g/dL (ref 3.5–5.0)
Alkaline Phosphatase: 48 U/L (ref 38–126)
Anion gap: 7 (ref 5–15)
BUN: 22 mg/dL (ref 8–23)
CO2: 24 mmol/L (ref 22–32)
Calcium: 8.8 mg/dL — ABNORMAL LOW (ref 8.9–10.3)
Chloride: 102 mmol/L (ref 98–111)
Creatinine, Ser: 0.96 mg/dL (ref 0.61–1.24)
GFR, Estimated: >60 mL/min (ref >60)
Glucose, Bld: 99 mg/dL (ref 70–99)
Potassium: 4 mmol/L (ref 3.5–5.1)
Sodium: 133 mmol/L — ABNORMAL LOW (ref 135–145)
Total Bilirubin: 0.8 mg/dL (ref 0.3–1.2)
Total Protein: 5.9 g/dL — ABNORMAL LOW (ref 6.5–8.1)

## 2020-12-22 LAB — RESP PANEL BY RT-PCR (FLU A&B, COVID) ARPGX2
Influenza A by PCR: NEGATIVE
Influenza B by PCR: NEGATIVE
SARS Coronavirus 2 by RT PCR: NEGATIVE

## 2020-12-22 LAB — ECHOCARDIOGRAM COMPLETE
Area-P 1/2: 4.06 cm2
Height: 74 in
S' Lateral: 3.42 cm
Weight: 2719.59 oz

## 2020-12-22 LAB — FIBRINOGEN: Fibrinogen: 266 mg/dL (ref 210–475)

## 2020-12-22 LAB — PROTIME-INR
INR: 1.2 (ref 0.8–1.2)
INR: 1.1 (ref 0.8–1.2)
Prothrombin Time: 14.3 seconds (ref 11.4–15.2)
Prothrombin Time: 14 seconds (ref 11.4–15.2)

## 2020-12-22 LAB — CBC
HCT: 47.8 % (ref 39.0–52.0)
Hemoglobin: 16 g/dL (ref 13.0–17.0)
MCH: 31.6 pg (ref 26.0–34.0)
MCHC: 33.5 g/dL (ref 30.0–36.0)
MCV: 94.5 fL (ref 80.0–100.0)
Platelets: 144 10*3/uL — ABNORMAL LOW (ref 150–400)
RBC: 5.06 MIL/uL (ref 4.22–5.81)
RDW: 13 % (ref 11.5–15.5)
WBC: 10.9 10*3/uL — ABNORMAL HIGH (ref 4.0–10.5)
nRBC: 0 % (ref 0.0–0.2)

## 2020-12-22 LAB — TSH: TSH: 2.629 u[IU]/mL (ref 0.350–4.500)

## 2020-12-22 LAB — FOLATE: Folate: 12.9 ng/mL (ref >5.9)

## 2020-12-22 LAB — MAGNESIUM: Magnesium: 2.1 mg/dL (ref 1.7–2.4)

## 2020-12-22 LAB — PROCALCITONIN: Procalcitonin: 0.31 ng/mL

## 2020-12-22 LAB — T4, FREE: Free T4: 1.08 ng/dL (ref 0.61–1.12)

## 2020-12-22 LAB — APTT
aPTT: 31 seconds (ref 24–36)
aPTT: 32 seconds (ref 24–36)

## 2020-12-22 LAB — VITAMIN B12: Vitamin B-12: 232 pg/mL (ref 180–914)

## 2020-12-22 LAB — PHOSPHORUS: Phosphorus: 3.8 mg/dL (ref 2.5–4.6)

## 2020-12-22 MED ORDER — ATORVASTATIN CALCIUM 10 MG PO TABS
10.0000 mg | ORAL_TABLET | Freq: Every day | ORAL | Status: DC
  Administered 2020-12-22: 10 mg via ORAL
  Filled 2020-12-22: qty 1

## 2020-12-22 MED ORDER — SODIUM CHLORIDE 0.9 % IV SOLN: Freq: Once | INTRAVENOUS | Status: AC

## 2020-12-22 NOTE — Progress Notes (Signed)
PROGRESS NOTE  Phillip Richardson LGX:[id number] DOB: 1946/01/29 DOA: 12/21/2020 PCP: Rosita Fire, MD  Brief History:  75 year old male with history of hypertension, hyperlipidemia, and tobacco abuse presenting with an episode of generalized weakness and diaphoresis.  The patient was eating at a [place] with his son and family when he developed a fairly sudden onset of diaphoresis, dizziness, and hot flash sensation.  He stated he had had a few bites of his meal prior to this episode.  He felt like he may have been incontinent of urine and stool.  However when he checked his underwear he only noted a smear of soilage of stool and no actual urine.  After this episode, the patient's helped him get up to get to the car.  The patient was able to walk under his own power.  During this entire process, the patient never lost consciousness and he was able to follow commands and remained conversant.  Apparently, there may have been some concern about possible slurred speech, but the patient himself denied any slurred speech, headache, visual disturbance, focal extremity weakness. He and his son eventually got back home.  With some assistance to get out of the car, the patient subsequently was able to get inside the house on his own power.  He continued to feel generalized weakness.  He went to the bathroom to have a bowel movement and get "cleaned up".  Subsequently, his son convinced him to come to the emergency department for further evaluation.  They came by private vehicle. Prior to this episode, the patient had been feeling well this past week.  He denied any new medications or over-the-counter supplements.  There has been no changes in the dosages of his prescription medications. patient denies fevers, chills, headache, chest pain, dyspnea, nausea, vomiting, diarrhea, abdominal pain, dysuria, hematuria, hematochezia, and melena.  He denies any alcohol or illicit drug use. In the emergency  department, the patient was afebrile but was hypotensive initially with blood pressure of 60/56 and subsequently 81/63.  Oxygen saturation was 90% on room air.  COVID-19 RT-PCR was negative BMP showed a serum creatinine 1.53 with sodium 133, potassium 4.1, bicarbonate 27.  LFTs were unremarkable.  WBC 13.0, hemoglobin 18.9, platelets 191,000.  CT angiogram chest and abdomen was negative for PE or dissection.  There was mild to moderate emphysema with right upper lobe nodules.  There is IMA, celiac, SMA were negative.  There was mild enhancement of his proximal to mid small bowel without wall thickening which was nonspecific but suggestive of possible infection versus inflammation.   Assessment/Plan: Acute kidney injury -Secondary to volume depletion and hemodynamic changes -Baseline creatinine 0.8-0.9 -serum creatinine peaked 1.53 -Continue IV fluids  Hypotension -Likely multifactorial including volume depletion, vagal response -CT angiogram chest and abdomen are essentially negative--as discussed above -Suspect the patient may have had an episode of food poisoning resulting in SB enhancement seen on CT abd and his hypotension -Continue IV fluids -Echocardiogram -UA neg for pyuria -follow blood cultures -check PCT -personally reviewed CXR--no infiltrates or edema -personally reviewed EKG--sinus, nonspecific T wave change  Lactic acidosis -Secondary to volume depletion -Continue IV fluids -Lactic acid peaked 3.2>>> 1.4  Hyponatremia -Secondary to volume depletion -He remains hyponatremic -Continue IV fluids  Essential hypertension -Holding lisinopril/HCTZ in the setting of hypotension  Hyperlipidemia -Continue statin  Thrombocytopenia -C14 -Folic acid -TSH -check coags -check fibrinogen  Tobacco abuse -cessation discussed     Status is:  Observation  The patient will require care spanning > 2 midnights and should be moved to inpatient because: IV treatments  appropriate due to intensity of illness or inability to take PO  Dispo: The patient is from: Home              Anticipated d/c is to: Home              Anticipated d/c date is: 12/23/20              Patient currently is not medically stable to d/c.   Difficult to place patient No        Family Communication:   No Family at bedside  Consultants:  none  Code Status:  FULL   DVT Prophylaxis:  [place] Heparin    Procedures: As Listed in Progress Note Above  Antibiotics: None       Subjective: Patient denies fevers, chills, headache, chest pain, dyspnea, nausea, vomiting, diarrhea, abdominal pain, dysuria, hematuria, hematochezia, and melena.   Objective: Vitals:   12/22/20 0430 12/22/20 0500 12/22/20 0600 12/22/20 0715  BP: 121/73 118/79 133/67   Pulse: (!) 59 (!) 59 (!) 59 67  Resp: 15 13 14 20   Temp:      TempSrc:      SpO2: 95% 96% 97% 95%  Weight:      Height:        Intake/Output Summary (Last 24 hours) at 12/22/2020 0810 Last data filed at 12/21/2020 2138 Gross per 24 hour  Intake 2000 ml  Output --  Net 2000 ml   Weight change:  Exam:   General:  Pt is alert, follows commands appropriately, not in acute distress  HEENT: No icterus, No thrush, No neck mass, [place]/AT  Cardiovascular: RRR, S1/S2, no rubs, no gallops  Respiratory: diminished BS. No wheeze  Abdomen: Soft/+BS, non tender, non distended, no guarding  Extremities: No edema, No lymphangitis, No petechiae, No rashes, no synovitis   Data Reviewed: I have personally reviewed following labs and imaging studies Basic Metabolic Panel: Recent Labs  Lab 12/21/20 2029 12/22/20 0519  NA 133* 133*  K 4.1 4.0  CL 96* 102  CO2 27 24  GLUCOSE 138* 99  BUN 22 22  CREATININE 1.53* 0.96  CALCIUM 9.8 8.8*  MG  --  2.1  PHOS  --  3.8   Liver Function Tests: Recent Labs  Lab 12/21/20 2029 12/22/20 0519  AST 23 17  ALT 18 15  ALKPHOS 57 48  BILITOT 0.8 0.8  PROT 7.3 5.9*  ALBUMIN 4.4  3.5   No results for input(s): LIPASE, AMYLASE in the last 168 hours. No results for input(s): AMMONIA in the last 168 hours. Coagulation Profile: Recent Labs  Lab 12/21/20 2029 12/22/20 0519  INR 1.1 1.2   CBC: Recent Labs  Lab 12/21/20 2029 12/22/20 0519  WBC 13.0* 10.9*  NEUTROABS 8.5*  --   HGB 18.9* 16.0  HCT 57.6* 47.8  MCV 95.5 94.5  PLT 191 144*   Cardiac Enzymes: No results for input(s): CKTOTAL, CKMB, CKMBINDEX, TROPONINI in the last 168 hours. BNP: Invalid input(s): POCBNP CBG: No results for input(s): GLUCAP in the last 168 hours. HbA1C: No results for input(s): HGBA1C in the last 72 hours. Urine analysis:    Component Value Date/Time   COLORURINE YELLOW 12/21/2020 2338   APPEARANCEUR CLEAR 12/21/2020 2338   LABSPEC >1.046 (H) 12/21/2020 2338   PHURINE 6.0 12/21/2020 2338   GLUCOSEU NEGATIVE 12/21/2020 2338   HGBUR SMALL (  A) 12/21/2020 2338   BILIRUBINUR NEGATIVE 12/21/2020 [place] 12/21/2020 2338   PROTEINUR NEGATIVE 12/21/2020 2338   UROBILINOGEN 0.2 02/23/2015 1625   NITRITE NEGATIVE 12/21/2020 2338   LEUKOCYTESUR NEGATIVE 12/21/2020 2338   Sepsis Labs: @LABRCNTIP (procalcitonin:4,lacticidven:4) ) Recent Results (from the past 240 hour(s))  Blood culture (routine single)     Status: None (Preliminary result)   Collection Time: 12/21/20  8:29 PM   Specimen: BLOOD LEFT ARM  Result Value Ref Range Status   Specimen Description BLOOD LEFT ARM  Final   Special Requests   Final    BOTTLES DRAWN AEROBIC AND ANAEROBIC Blood Culture adequate volume   Culture   Final    NO GROWTH < 12 HOURS Performed at [facility], [address] [place], [place] [address]    Report Status PENDING  Incomplete  Resp Panel by RT-PCR (Flu A&B, Covid) Nasopharyngeal Swab     Status: None   Collection Time: 12/21/20  8:43 PM   Specimen: Nasopharyngeal Swab; Nasopharyngeal(NP) swabs in vial transport medium  Result Value Ref Range Status   SARS  Coronavirus 2 by RT PCR NEGATIVE NEGATIVE Final    Comment: (NOTE) SARS-CoV-2 target nucleic acids are NOT DETECTED.  The SARS-CoV-2 RNA is generally detectable in upper respiratory specimens during the acute phase of infection. The lowest concentration of SARS-CoV-2 viral copies this assay can detect is 138 copies/mL. A negative result does not preclude SARS-Cov-2 infection and should not be used as the sole basis for treatment or other patient management decisions. A negative result may occur with  improper specimen collection/handling, submission of specimen other than nasopharyngeal swab, presence of viral mutation(s) within the areas targeted by this assay, and inadequate number of viral copies(<138 copies/mL). A negative result must be combined with clinical observations, patient history, and epidemiological information. The expected result is Negative.  Fact Sheet for Patients:  EntrepreneurPulse.com.au  Fact Sheet for Healthcare Providers:  IncredibleEmployment.be  This test is no t yet approved or cleared by the [place] FDA and  has been authorized for detection and/or diagnosis of SARS-CoV-2 by FDA under an Emergency Use Authorization (EUA). This EUA will remain  in effect (meaning this test can be used) for the duration of the COVID-19 declaration under Section 564(b)(1) of the Act, 21 U.S.C.section 360bbb-3(b)(1), unless the authorization is terminated  or revoked sooner.       Influenza A by PCR NEGATIVE NEGATIVE Final   Influenza B by PCR NEGATIVE NEGATIVE Final    Comment: (NOTE) The Xpert Xpress SARS-CoV-2/FLU/RSV plus assay is intended as an aid in the diagnosis of influenza from Nasopharyngeal swab specimens and should not be used as a sole basis for treatment. Nasal washings and aspirates are unacceptable for Xpert Xpress SARS-CoV-2/FLU/RSV testing.  Fact Sheet for  Patients: EntrepreneurPulse.com.au  Fact Sheet for Healthcare Providers: IncredibleEmployment.be  This test is not yet approved or cleared by the [place] FDA and has been authorized for detection and/or diagnosis of SARS-CoV-2 by FDA under an Emergency Use Authorization (EUA). This EUA will remain in effect (meaning this test can be used) for the duration of the COVID-19 declaration under Section 564(b)(1) of the Act, 21 U.S.C. section 360bbb-3(b)(1), unless the authorization is terminated or revoked.  Performed at [facility], [address] [place], [place] [address]      Scheduled Meds: . atorvastatin  10 mg Oral Daily  . heparin  5,000 Units Subcutaneous Q8H   Continuous Infusions:  Procedures/Studies: DG Chest  Port 1 View  Result Date: 12/21/2020 CLINICAL DATA:  Sudden weakness, hypotensive, diaphoresis, current smoker, testing for COVID 19 EXAM: PORTABLE CHEST 1 VIEW COMPARISON:  CT 12/21/2020 FINDINGS: Coarsened reticular changes and bronchitic features are seen throughout the chest, slightly increased from prior imaging. Biapical pleuroparenchymal scarring is similar to comparison. No pneumothorax. No effusion. The aorta is calcified. The remaining cardiomediastinal contours are unremarkable. No acute osseous abnormality or suspicious osseous lesion. Degenerative changes are present in the imaged spine and shoulders. Telemetry leads overlie the chest. IMPRESSION: Slightly coarsened reticular and bronchitic features. No consolidative process or features of edema. Aortic Atherosclerosis (ICD10-I70.0). Electronically Signed   By: Lovena Le M.D.   On: 12/21/2020 20:33   CT Angio Chest/Abd/Pel for Dissection W and/or Wo Contrast  Result Date: 12/21/2020 CLINICAL DATA:  Pt was sitting at dinner when all of sudden became weak, incontinence, slurred speech, diaphoretic. BP 60/52Abdominal pain, aortic dissection suspected EXAM: CT ANGIOGRAPHY  CHEST, ABDOMEN AND PELVIS TECHNIQUE: Non-contrast CT of the chest was initially obtained. Multidetector CT imaging through the chest, abdomen and pelvis was performed using the standard protocol during bolus administration of intravenous contrast. Multiplanar reconstructed images and MIPs were obtained and reviewed to evaluate the vascular anatomy. CONTRAST:  197mL OMNIPAQUE IOHEXOL 350 MG/ML SOLN COMPARISON:  None. FINDINGS: CTA CHEST FINDINGS Cardiovascular: No aortic dissection. No aortic aneurysm. Aortic atherosclerosis without stenosis. There are atherosclerotic changes the arch branch vessels with no significant stenosis. Central pulmonary arteries are well opacified. No evidence of a central pulmonary embolism. Heart is normal in size and configuration. No pericardial effusion. Three-vessel coronary artery calcifications. Mediastinum/Nodes: No enlarged mediastinal, hilar, or axillary lymph nodes. Thyroid gland, trachea, and esophagus demonstrate no significant findings. Lungs/Pleura: No pneumonia or pulmonary edema. Mild to moderate centrilobular emphysema. Apical pleuroparenchymal scarring. Small irregular nodular opacity in the right upper lobe, image 81, series 8, 3 mm. 3 mm similar appearing opacity in the more inferior right upper lobe, image 90. No other nodules. No masses. No pleural effusion or pneumothorax. Musculoskeletal: No fracture or acute finding. No bone lesion. No chest wall masses. Review of the MIP images confirms the above findings. CTA ABDOMEN AND PELVIS FINDINGS VASCULAR Aorta: Atherosclerotic disease. No significant stenosis. No aneurysm or dissection. Celiac: Patent without evidence of aneurysm, dissection, vasculitis or significant stenosis. SMA: Patent without evidence of aneurysm, dissection, vasculitis or significant stenosis. Renals: Mild atherosclerotic change at the origin of the renal arteries, left greater than right without significant stenosis. No evidence of aneurysm,  dissection or vasculitis. IMA: Small inferior mesenteric artery. Inflow: Diffuse atherosclerotic change along the common, external and internal iliac arteries. No significant stenosis. Veins: No obvious venous abnormality within the limitations of this arterial phase study. Review of the MIP images confirms the above findings. NON-VASCULAR Hepatobiliary: Normal in size and overall attenuation. Low-density lesion along the posterior aspect of segment 6, 1 cm, consistent with a cyst. No other masses or lesions. Normal gallbladder. No bile duct dilation. Pancreas: Unremarkable. No pancreatic ductal dilatation or surrounding inflammatory changes. Spleen: Normal in size without focal abnormality. Adrenals/Urinary Tract: Left adrenal mass, 1.7 cm, most likely an adenoma. Small right adrenal mass versus nodular hyperplasia. Kidneys normal in size, orientation and position. No masses, stones or hydronephrosis. Normal ureters. Bladder minimally distended, otherwise unremarkable. Stomach/Bowel: Stomach moderately distended, otherwise unremarkable. Diverticulum from the second portion of the duodenum indents the pancreatic head lying just posterior to the distal common bile duct. Prominent enhancement the proximal to mid small bowel, no  convincing wall thickening. Colon is normal in caliber. No wall thickening or inflammation. Numerous left colon diverticula mostly along the sigmoid. No evidence of diverticulitis. Appendix is prominent, 7 mm in diameter, but shows no wall thickening and contains internal air. This is felt be a normal variant for this patient. Lymphatic: No enlarged lymph nodes. Reproductive: Mild prostate enlargement, 4.8 x 4.0 cm transversely. Other: Small amount of ascites collecting along the anterior margin of the liver, more notably adjacent to the spleen and in the posterior pelvic recess. Musculoskeletal: No fracture or acute finding.  No bone lesion. Review of the MIP images confirms the above  findings. IMPRESSION: CTA FINDINGS 1. No aortic dissection or aneurysm. 2. No acute findings. 3. Aortic and branch vessel atherosclerosis. No significant stenosis. NON CTA FINDINGS 1. No acute findings in the chest. 2. Three-vessel coronary artery calcifications. 3. Moderate emphysema. 4. 2 small lung nodules on the right. No follow-up needed if patient is low-risk (and has no known or suspected primary neoplasm). Non-contrast chest CT can be considered in 12 months if patient is high-risk. This recommendation follows the consensus statement: Guidelines for Management of Incidental Pulmonary Nodules Detected on CT Images: From the Fleischner Society 2017; Radiology 2017; 284:228-243. 5. Abnormal appearance of the small bowel with significant wall enhancement. This is nonspecific, but consider small-bowel infection or inflammation in the proper clinical setting. There is no convincing wall thickening, however. 6. Small amount of ascites of unclear etiology. 7. Adrenal masses, left greater than right, most likely adenomas. 8. Left colon diverticulosis without evidence of diverticulitis. Electronically Signed   By: Lajean Manes M.D.   On: 12/21/2020 20:49    Orson Eva, DO  [facility]  If 7PM-7AM, please contact night-coverage [email or web address] Password [email or web address] 12/22/2020, 8:10 AM   LOS: 0 days

## 2020-12-22 NOTE — Progress Notes (Signed)
  Echocardiogram 2D Echocardiogram has been performed.  Matilde Bash 12/22/2020, 9:34 AM

## 2020-12-22 NOTE — ED Notes (Signed)
Pt is inpatient status, vital signs will be obtained accordingly.

## 2020-12-22 NOTE — Care Management Obs Status (Signed)
[facility] NOTIFICATION   Patient Details  Name: Phillip Richardson MRN: [id number] Date of Birth: [birth year]   Medicare Observation Status Notification Given:  Yes    Natasha Bence, LCSW 12/22/2020, 2:47 PM

## 2020-12-23 DIAGNOSIS — E861 Hypovolemia: Secondary | ICD-10-CM | POA: Diagnosis not present

## 2020-12-23 DIAGNOSIS — E871 Hypo-osmolality and hyponatremia: Secondary | ICD-10-CM | POA: Diagnosis not present

## 2020-12-23 DIAGNOSIS — E860 Dehydration: Secondary | ICD-10-CM | POA: Diagnosis not present

## 2020-12-23 DIAGNOSIS — E872 Acidosis: Secondary | ICD-10-CM | POA: Diagnosis not present

## 2020-12-23 DIAGNOSIS — N179 Acute kidney failure, unspecified: Secondary | ICD-10-CM | POA: Diagnosis not present

## 2020-12-23 DIAGNOSIS — Z720 Tobacco use: Secondary | ICD-10-CM | POA: Diagnosis not present

## 2020-12-23 DIAGNOSIS — I9589 Other hypotension: Secondary | ICD-10-CM | POA: Diagnosis not present

## 2020-12-23 DIAGNOSIS — R55 Syncope and collapse: Secondary | ICD-10-CM

## 2020-12-23 DIAGNOSIS — I10 Essential (primary) hypertension: Secondary | ICD-10-CM | POA: Diagnosis not present

## 2020-12-23 LAB — CBC WITH DIFFERENTIAL/PLATELET
Abs Immature Granulocytes: 0.03 10*3/uL (ref 0.00–0.07)
Basophils Absolute: 0 10*3/uL (ref 0.0–0.1)
Basophils Relative: 0 %
Eosinophils Absolute: 0.2 10*3/uL (ref 0.0–0.5)
Eosinophils Relative: 2 %
HCT: 44.3 % (ref 39.0–52.0)
Hemoglobin: 14.9 g/dL (ref 13.0–17.0)
Immature Granulocytes: 0 %
Lymphocytes Relative: 22 %
Lymphs Abs: 2.2 10*3/uL (ref 0.7–4.0)
MCH: 31.8 pg (ref 26.0–34.0)
MCHC: 33.6 g/dL (ref 30.0–36.0)
MCV: 94.7 fL (ref 80.0–100.0)
Monocytes Absolute: 1 10*3/uL (ref 0.1–1.0)
Monocytes Relative: 10 %
Neutro Abs: 6.5 10*3/uL (ref 1.7–7.7)
Neutrophils Relative %: 66 %
Platelets: 144 10*3/uL — ABNORMAL LOW (ref 150–400)
RBC: 4.68 MIL/uL (ref 4.22–5.81)
RDW: 13 % (ref 11.5–15.5)
WBC: 10 10*3/uL (ref 4.0–10.5)
nRBC: 0 % (ref 0.0–0.2)

## 2020-12-23 LAB — COMPREHENSIVE METABOLIC PANEL
ALT: 15 U/L (ref 0–44)
AST: 19 U/L (ref 15–41)
Albumin: 3.5 g/dL (ref 3.5–5.0)
Alkaline Phosphatase: 46 U/L (ref 38–126)
Anion gap: 7 (ref 5–15)
BUN: 18 mg/dL (ref 8–23)
CO2: 24 mmol/L (ref 22–32)
Calcium: 8.8 mg/dL — ABNORMAL LOW (ref 8.9–10.3)
Chloride: 104 mmol/L (ref 98–111)
Creatinine, Ser: 0.78 mg/dL (ref 0.61–1.24)
GFR, Estimated: >60 mL/min (ref >60)
Glucose, Bld: 96 mg/dL (ref 70–99)
Potassium: 4.1 mmol/L (ref 3.5–5.1)
Sodium: 135 mmol/L (ref 135–145)
Total Bilirubin: 1.1 mg/dL (ref 0.3–1.2)
Total Protein: 6 g/dL — ABNORMAL LOW (ref 6.5–8.1)

## 2020-12-23 LAB — URINE CULTURE: Culture: NO GROWTH

## 2020-12-23 LAB — MAGNESIUM: Magnesium: 1.9 mg/dL (ref 1.7–2.4)

## 2020-12-23 NOTE — Discharge Summary (Signed)
Physician Discharge Summary  Phillip Richardson ZOX:[id number] DOB: 06-Apr-1946 DOA: 12/21/2020  PCP: Rosita Fire, MD  Admit date: 12/21/2020 Discharge date: 12/23/2020  Admitted From: Home Disposition:  Home   Recommendations for Outpatient Follow-up:  1. Follow up with PCP in 1-2 weeks 2. Please obtain BMP/CBC in one week    Discharge Condition: Stable CODE STATUS: FULL Diet recommendation: Heart Healthy   Brief/Interim Summary: 75 year old male with history of hypertension, hyperlipidemia, and tobacco abuse presenting with an episode of generalized weakness and diaphoresis.  The patient was eating at a [place] with his son and family when he developed a fairly sudden onset of diaphoresis, dizziness, and hot flash sensation.  He stated he had had a few bites of his meal prior to this episode.  He felt like he may have been incontinent of urine and stool.  However when he checked his underwear he only noted a smear of soilage of stool and no actual urine.  After this episode, the patient's helped him get up to get to the car.  The patient was able to walk under his own power.  During this entire process, the patient never lost consciousness and he was able to follow commands and remained conversant.  Apparently, there may have been some concern about possible slurred speech, but the patient himself denied any slurred speech, headache, visual disturbance, focal extremity weakness. He and his son eventually got back home.  With some assistance to get out of the car, the patient subsequently was able to get inside the house on his own power.  He continued to feel generalized weakness.  He went to the bathroom to have a bowel movement and get "cleaned up".  Subsequently, his son convinced him to come to the emergency department for further evaluation.  They came by private vehicle. Prior to this episode, the patient had been feeling well this past week.  He denied any new medications or  over-the-counter supplements.  There has been no changes in the dosages of his prescription medications. patient denies fevers, chills, headache, chest pain, dyspnea, nausea, vomiting, diarrhea, abdominal pain, dysuria, hematuria, hematochezia, and melena.  He denies any alcohol or illicit drug use. In the emergency department, the patient was afebrile but was hypotensive initially with blood pressure of 60/56 and subsequently 81/63.  Oxygen saturation was 90% on room air.  COVID-19 RT-PCR was negative BMP showed a serum creatinine 1.53 with sodium 133, potassium 4.1, bicarbonate 27.  LFTs were unremarkable.  WBC 13.0, hemoglobin 18.9, platelets 191,000.  CT angiogram chest and abdomen was negative for PE or dissection.  There was mild to moderate emphysema with right upper lobe nodules.  There is IMA, celiac, SMA were negative.  There was mild enhancement of his proximal to mid small bowel without wall thickening which was nonspecific but suggestive of possible infection versus inflammation.  Discharge Diagnoses:  Acute kidney injury -Secondary to volume depletion and hemodynamic changes -Baseline creatinine 0.8-0.9 -serum creatinine peaked 1.53 -Continued IV fluids -resolved -serum creatinine 0.78 at time of d/c  Hypotension -Likely multifactorial including volume depletion, vagal response -CT angiogram chest and abdomen are essentially negative--as discussed above -Suspect the patient may have had an episode of food poisoning resulting in SB enhancement seen on CT abd and his hypotension -Continued IV fluids -Echocardiogram--EF 60-65%, no WMA, trivial MR -UA neg for pyuria -follow blood cultures--neg at time of d/c -check PCT--0.31 -personally reviewed CXR--no infiltrates or edema -personally reviewed EKG--sinus, nonspecific T wave change -overall improved--patient  hypertensive at time of d/c  Lactic acidosis -Secondary to volume depletion -Continue IV fluids -Lactic acid peaked  3.2>>> 1.4  Hyponatremia -Secondary to volume depletion -Continued IV fluids--improved -overall improved  Essential hypertension -Holding lisinopril/HCTZ in the setting of hypotension -BP improved  Hyperlipidemia -Continue statin  Thrombocytopenia -D78--242 -Folic PNTI--14.4 -RXV--4.00 -check coags--INR 1.1,  -check fibrinogen--266  Tobacco abuse -cessation discussed   Discharge Instructions   Allergies as of 12/23/2020   No Known Allergies     Medication List    TAKE these medications   aspirin 81 MG chewable tablet Chew 1 tablet (81 mg total) by mouth daily.   atorvastatin 10 MG tablet Commonly known as: LIPITOR Take 10 mg by mouth daily.   diphenhydramine-acetaminophen 25-500 MG Tabs tablet Commonly known as: TYLENOL PM Take 1 tablet by mouth at bedtime.   ibuprofen 200 MG tablet Commonly known as: ADVIL Take 400 mg by mouth every 8 (eight) hours as needed (for pain.).   lisinopril-hydrochlorothiazide 20-12.5 MG tablet Commonly known as: ZESTORETIC Take 1 tablet by mouth daily.       No Known Allergies  Consultations:  none   Procedures/Studies: DG Chest Port 1 View  Result Date: 12/21/2020 CLINICAL DATA:  Sudden weakness, hypotensive, diaphoresis, current smoker, testing for COVID 19 EXAM: PORTABLE CHEST 1 VIEW COMPARISON:  CT 12/21/2020 FINDINGS: Coarsened reticular changes and bronchitic features are seen throughout the chest, slightly increased from prior imaging. Biapical pleuroparenchymal scarring is similar to comparison. No pneumothorax. No effusion. The aorta is calcified. The remaining cardiomediastinal contours are unremarkable. No acute osseous abnormality or suspicious osseous lesion. Degenerative changes are present in the imaged spine and shoulders. Telemetry leads overlie the chest. IMPRESSION: Slightly coarsened reticular and bronchitic features. No consolidative process or features of edema. Aortic Atherosclerosis  (ICD10-I70.0). Electronically Signed   By: Lovena Le M.D.   On: 12/21/2020 20:33   ECHOCARDIOGRAM COMPLETE  Result Date: 12/22/2020    ECHOCARDIOGRAM REPORT   Patient Name:   Phillip Richardson Date of Exam: 12/22/2020 Medical Rec #:  [id number]      Height:       74.0 in Accession #:    [id number]     Weight:       170.0 lb Date of Birth:  27-Sep-1946     BSA:          2.028 m Patient Age:    75 years       BP:           159/95 mmHg Patient Gender: M              HR:           72 bpm. Exam Location:  [facility] Procedure: 2D Echo, Cardiac Doppler and Color Doppler Indications:    Dyspnea  History:        Patient has prior history of Echocardiogram examinations, most                 recent 02/24/2015. TIA and COPD, Signs/Symptoms:Dyspnea; Risk                 Factors:Hypertension, Dyslipidemia and Current Smoker.  Sonographer:    Dustin Flock RDCS Referring Phys: [phone] Flor Whitacre IMPRESSIONS  1. Left ventricular ejection fraction, by estimation, is 60 to 65%. The left ventricle has normal function. The left ventricle has no regional wall motion abnormalities. There is mild concentric left ventricular hypertrophy. Left ventricular diastolic parameters are indeterminate.  2. Right ventricular systolic  function is normal. The right ventricular size is normal.  3. The mitral valve is grossly normal. Trivial mitral valve regurgitation.  4. The aortic valve is tricuspid. There is mild calcification of the aortic valve. Aortic valve regurgitation is not visualized. No aortic stenosis is present.  5. The inferior vena cava is normal in size with greater than 50% respiratory variability, suggesting right atrial pressure of 3 mmHg. Comparison(s): A prior study was performed on 02/24/2015. No significant change from prior study. FINDINGS  Left Ventricle: Left ventricular ejection fraction, by estimation, is 60 to 65%. The left ventricle has normal function. The left ventricle has no regional wall motion abnormalities. The  left ventricular internal cavity size was normal in size. There is  mild concentric left ventricular hypertrophy. Left ventricular diastolic parameters are indeterminate. Right Ventricle: The right ventricular size is normal. No increase in right ventricular wall thickness. Right ventricular systolic function is normal. Left Atrium: Left atrial size was normal in size. Right Atrium: Right atrial size was normal in size. Pericardium: There is no evidence of pericardial effusion. Mitral Valve: The mitral valve is grossly normal. There is mild thickening of the mitral valve leaflet(s). Mild mitral annular calcification. Trivial mitral valve regurgitation. Tricuspid Valve: The tricuspid valve is normal in structure. Tricuspid valve regurgitation is not demonstrated. Aortic Valve: The aortic valve is tricuspid. There is mild calcification of the aortic valve. Aortic valve regurgitation is not visualized. No aortic stenosis is present. Pulmonic Valve: The pulmonic valve was not well visualized. Pulmonic valve regurgitation is not visualized. No evidence of pulmonic stenosis. Aorta: The aortic root is normal in size and structure. Venous: The inferior vena cava is normal in size with greater than 50% respiratory variability, suggesting right atrial pressure of 3 mmHg. IAS/Shunts: The atrial septum is grossly normal.  LEFT VENTRICLE PLAX 2D LVIDd:         4.89 cm  Diastology LVIDs:         3.42 cm  LV e' medial:    9.03 cm/s LV PW:         1.11 cm  LV E/e' medial:  7.5 LV IVS:        1.01 cm  LV e' lateral:   9.36 cm/s LVOT diam:     2.30 cm  LV E/e' lateral: 7.2 LV SV:         81 LV SV Index:   40 LVOT Area:     4.15 cm  RIGHT VENTRICLE RV Basal diam:  3.08 cm RV S prime:     11.70 cm/s TAPSE (M-mode): 2.8 cm LEFT ATRIUM             Index       RIGHT ATRIUM           Index LA diam:        3.40 cm 1.68 cm/m  RA Area:     15.30 cm LA Vol (A2C):   31.3 ml 15.43 ml/m RA Volume:   36.30 ml  17.90 ml/m LA Vol (A4C):   58.0  ml 28.60 ml/m LA Biplane Vol: 45.9 ml 22.63 ml/m  AORTIC VALVE LVOT Vmax:   90.10 cm/s LVOT Vmean:  61.100 cm/s LVOT VTI:    0.194 m  AORTA Ao Root diam: 2.90 cm MITRAL VALVE MV Area (PHT): 4.06 cm    SHUNTS MV Decel Time: 187 msec    Systemic VTI:  0.19 m MV E velocity: 67.70 cm/s  Systemic Diam: 2.30 cm MV A  velocity: 39.00 cm/s MV E/A ratio:  1.74 Rudean Haskell MD Electronically signed by Rudean Haskell MD Signature Date/Time: 12/22/2020/2:47:41 PM    Final    CT Angio Chest/Abd/Pel for Dissection W and/or Wo Contrast  Result Date: 12/21/2020 CLINICAL DATA:  Pt was sitting at dinner when all of sudden became weak, incontinence, slurred speech, diaphoretic. BP 60/52Abdominal pain, aortic dissection suspected EXAM: CT ANGIOGRAPHY CHEST, ABDOMEN AND PELVIS TECHNIQUE: Non-contrast CT of the chest was initially obtained. Multidetector CT imaging through the chest, abdomen and pelvis was performed using the standard protocol during bolus administration of intravenous contrast. Multiplanar reconstructed images and MIPs were obtained and reviewed to evaluate the vascular anatomy. CONTRAST:  146mL OMNIPAQUE IOHEXOL 350 MG/ML SOLN COMPARISON:  None. FINDINGS: CTA CHEST FINDINGS Cardiovascular: No aortic dissection. No aortic aneurysm. Aortic atherosclerosis without stenosis. There are atherosclerotic changes the arch branch vessels with no significant stenosis. Central pulmonary arteries are well opacified. No evidence of a central pulmonary embolism. Heart is normal in size and configuration. No pericardial effusion. Three-vessel coronary artery calcifications. Mediastinum/Nodes: No enlarged mediastinal, hilar, or axillary lymph nodes. Thyroid gland, trachea, and esophagus demonstrate no significant findings. Lungs/Pleura: No pneumonia or pulmonary edema. Mild to moderate centrilobular emphysema. Apical pleuroparenchymal scarring. Small irregular nodular opacity in the right upper lobe, image 81, series  8, 3 mm. 3 mm similar appearing opacity in the more inferior right upper lobe, image 90. No other nodules. No masses. No pleural effusion or pneumothorax. Musculoskeletal: No fracture or acute finding. No bone lesion. No chest wall masses. Review of the MIP images confirms the above findings. CTA ABDOMEN AND PELVIS FINDINGS VASCULAR Aorta: Atherosclerotic disease. No significant stenosis. No aneurysm or dissection. Celiac: Patent without evidence of aneurysm, dissection, vasculitis or significant stenosis. SMA: Patent without evidence of aneurysm, dissection, vasculitis or significant stenosis. Renals: Mild atherosclerotic change at the origin of the renal arteries, left greater than right without significant stenosis. No evidence of aneurysm, dissection or vasculitis. IMA: Small inferior mesenteric artery. Inflow: Diffuse atherosclerotic change along the common, external and internal iliac arteries. No significant stenosis. Veins: No obvious venous abnormality within the limitations of this arterial phase study. Review of the MIP images confirms the above findings. NON-VASCULAR Hepatobiliary: Normal in size and overall attenuation. Low-density lesion along the posterior aspect of segment 6, 1 cm, consistent with a cyst. No other masses or lesions. Normal gallbladder. No bile duct dilation. Pancreas: Unremarkable. No pancreatic ductal dilatation or surrounding inflammatory changes. Spleen: Normal in size without focal abnormality. Adrenals/Urinary Tract: Left adrenal mass, 1.7 cm, most likely an adenoma. Small right adrenal mass versus nodular hyperplasia. Kidneys normal in size, orientation and position. No masses, stones or hydronephrosis. Normal ureters. Bladder minimally distended, otherwise unremarkable. Stomach/Bowel: Stomach moderately distended, otherwise unremarkable. Diverticulum from the second portion of the duodenum indents the pancreatic head lying just posterior to the distal common bile duct.  Prominent enhancement the proximal to mid small bowel, no convincing wall thickening. Colon is normal in caliber. No wall thickening or inflammation. Numerous left colon diverticula mostly along the sigmoid. No evidence of diverticulitis. Appendix is prominent, 7 mm in diameter, but shows no wall thickening and contains internal air. This is felt be a normal variant for this patient. Lymphatic: No enlarged lymph nodes. Reproductive: Mild prostate enlargement, 4.8 x 4.0 cm transversely. Other: Small amount of ascites collecting along the anterior margin of the liver, more notably adjacent to the spleen and in the posterior pelvic recess. Musculoskeletal: No fracture or acute  finding.  No bone lesion. Review of the MIP images confirms the above findings. IMPRESSION: CTA FINDINGS 1. No aortic dissection or aneurysm. 2. No acute findings. 3. Aortic and branch vessel atherosclerosis. No significant stenosis. NON CTA FINDINGS 1. No acute findings in the chest. 2. Three-vessel coronary artery calcifications. 3. Moderate emphysema. 4. 2 small lung nodules on the right. No follow-up needed if patient is low-risk (and has no known or suspected primary neoplasm). Non-contrast chest CT can be considered in 12 months if patient is high-risk. This recommendation follows the consensus statement: Guidelines for Management of Incidental Pulmonary Nodules Detected on CT Images: From the Fleischner Society 2017; Radiology 2017; 284:228-243. 5. Abnormal appearance of the small bowel with significant wall enhancement. This is nonspecific, but consider small-bowel infection or inflammation in the proper clinical setting. There is no convincing wall thickening, however. 6. Small amount of ascites of unclear etiology. 7. Adrenal masses, left greater than right, most likely adenomas. 8. Left colon diverticulosis without evidence of diverticulitis. Electronically Signed   By: Lajean Manes M.D.   On: 12/21/2020 20:49          Discharge Exam: Vitals:   12/23/20 0845 12/23/20 0850  BP:  (!) 156/93  Pulse: 75 78  Resp: 17 (!) 23  Temp:    SpO2: 95% 97%   Vitals:   12/23/20 0400 12/23/20 0600 12/23/20 0845 12/23/20 0850  BP: (!) 146/97 (!) 154/81  (!) 156/93  Pulse: 77 62 75 78  Resp: 17 15 17  (!) 23  Temp:      TempSrc:      SpO2: 93% 94% 95% 97%  Weight:      Height:        General: Pt is alert, awake, not in acute distress Cardiovascular: RRR, S1/S2 +, no rubs, no gallops Respiratory: CTA bilaterally, no wheezing, no rhonchi Abdominal: Soft, NT, ND, bowel sounds + Extremities: no edema, no cyanosis   The results of significant diagnostics from this hospitalization (including imaging, microbiology, ancillary and laboratory) are listed below for reference.    Significant Diagnostic Studies: DG Chest Port 1 View  Result Date: 12/21/2020 CLINICAL DATA:  Sudden weakness, hypotensive, diaphoresis, current smoker, testing for COVID 19 EXAM: PORTABLE CHEST 1 VIEW COMPARISON:  CT 12/21/2020 FINDINGS: Coarsened reticular changes and bronchitic features are seen throughout the chest, slightly increased from prior imaging. Biapical pleuroparenchymal scarring is similar to comparison. No pneumothorax. No effusion. The aorta is calcified. The remaining cardiomediastinal contours are unremarkable. No acute osseous abnormality or suspicious osseous lesion. Degenerative changes are present in the imaged spine and shoulders. Telemetry leads overlie the chest. IMPRESSION: Slightly coarsened reticular and bronchitic features. No consolidative process or features of edema. Aortic Atherosclerosis (ICD10-I70.0). Electronically Signed   By: Lovena Le M.D.   On: 12/21/2020 20:33   ECHOCARDIOGRAM COMPLETE  Result Date: 12/22/2020    ECHOCARDIOGRAM REPORT   Patient Name:   Phillip Richardson Date of Exam: 12/22/2020 Medical Rec #:  [id number]      Height:       74.0 in Accession #:    [id number]     Weight:        170.0 lb Date of Birth:  05-20-1946     BSA:          2.028 m Patient Age:    85 years       BP:           159/95 mmHg Patient Gender: M  HR:           72 bpm. Exam Location:  [facility] Procedure: 2D Echo, Cardiac Doppler and Color Doppler Indications:    Dyspnea  History:        Patient has prior history of Echocardiogram examinations, most                 recent 02/24/2015. TIA and COPD, Signs/Symptoms:Dyspnea; Risk                 Factors:Hypertension, Dyslipidemia and Current Smoker.  Sonographer:    Dustin Flock RDCS Referring Phys: [phone] Jameir Ake IMPRESSIONS  1. Left ventricular ejection fraction, by estimation, is 60 to 65%. The left ventricle has normal function. The left ventricle has no regional wall motion abnormalities. There is mild concentric left ventricular hypertrophy. Left ventricular diastolic parameters are indeterminate.  2. Right ventricular systolic function is normal. The right ventricular size is normal.  3. The mitral valve is grossly normal. Trivial mitral valve regurgitation.  4. The aortic valve is tricuspid. There is mild calcification of the aortic valve. Aortic valve regurgitation is not visualized. No aortic stenosis is present.  5. The inferior vena cava is normal in size with greater than 50% respiratory variability, suggesting right atrial pressure of 3 mmHg. Comparison(s): A prior study was performed on 02/24/2015. No significant change from prior study. FINDINGS  Left Ventricle: Left ventricular ejection fraction, by estimation, is 60 to 65%. The left ventricle has normal function. The left ventricle has no regional wall motion abnormalities. The left ventricular internal cavity size was normal in size. There is  mild concentric left ventricular hypertrophy. Left ventricular diastolic parameters are indeterminate. Right Ventricle: The right ventricular size is normal. No increase in right ventricular wall thickness. Right ventricular systolic function is normal.  Left Atrium: Left atrial size was normal in size. Right Atrium: Right atrial size was normal in size. Pericardium: There is no evidence of pericardial effusion. Mitral Valve: The mitral valve is grossly normal. There is mild thickening of the mitral valve leaflet(s). Mild mitral annular calcification. Trivial mitral valve regurgitation. Tricuspid Valve: The tricuspid valve is normal in structure. Tricuspid valve regurgitation is not demonstrated. Aortic Valve: The aortic valve is tricuspid. There is mild calcification of the aortic valve. Aortic valve regurgitation is not visualized. No aortic stenosis is present. Pulmonic Valve: The pulmonic valve was not well visualized. Pulmonic valve regurgitation is not visualized. No evidence of pulmonic stenosis. Aorta: The aortic root is normal in size and structure. Venous: The inferior vena cava is normal in size with greater than 50% respiratory variability, suggesting right atrial pressure of 3 mmHg. IAS/Shunts: The atrial septum is grossly normal.  LEFT VENTRICLE PLAX 2D LVIDd:         4.89 cm  Diastology LVIDs:         3.42 cm  LV e' medial:    9.03 cm/s LV PW:         1.11 cm  LV E/e' medial:  7.5 LV IVS:        1.01 cm  LV e' lateral:   9.36 cm/s LVOT diam:     2.30 cm  LV E/e' lateral: 7.2 LV SV:         81 LV SV Index:   40 LVOT Area:     4.15 cm  RIGHT VENTRICLE RV Basal diam:  3.08 cm RV S prime:     11.70 cm/s TAPSE (M-mode): 2.8 cm LEFT ATRIUM  Index       RIGHT ATRIUM           Index LA diam:        3.40 cm 1.68 cm/m  RA Area:     15.30 cm LA Vol (A2C):   31.3 ml 15.43 ml/m RA Volume:   36.30 ml  17.90 ml/m LA Vol (A4C):   58.0 ml 28.60 ml/m LA Biplane Vol: 45.9 ml 22.63 ml/m  AORTIC VALVE LVOT Vmax:   90.10 cm/s LVOT Vmean:  61.100 cm/s LVOT VTI:    0.194 m  AORTA Ao Root diam: 2.90 cm MITRAL VALVE MV Area (PHT): 4.06 cm    SHUNTS MV Decel Time: 187 msec    Systemic VTI:  0.19 m MV E velocity: 67.70 cm/s  Systemic Diam: 2.30 cm MV A  velocity: 39.00 cm/s MV E/A ratio:  1.74 Rudean Haskell MD Electronically signed by Rudean Haskell MD Signature Date/Time: 12/22/2020/2:47:41 PM    Final    CT Angio Chest/Abd/Pel for Dissection W and/or Wo Contrast  Result Date: 12/21/2020 CLINICAL DATA:  Pt was sitting at dinner when all of sudden became weak, incontinence, slurred speech, diaphoretic. BP 60/52Abdominal pain, aortic dissection suspected EXAM: CT ANGIOGRAPHY CHEST, ABDOMEN AND PELVIS TECHNIQUE: Non-contrast CT of the chest was initially obtained. Multidetector CT imaging through the chest, abdomen and pelvis was performed using the standard protocol during bolus administration of intravenous contrast. Multiplanar reconstructed images and MIPs were obtained and reviewed to evaluate the vascular anatomy. CONTRAST:  118mL OMNIPAQUE IOHEXOL 350 MG/ML SOLN COMPARISON:  None. FINDINGS: CTA CHEST FINDINGS Cardiovascular: No aortic dissection. No aortic aneurysm. Aortic atherosclerosis without stenosis. There are atherosclerotic changes the arch branch vessels with no significant stenosis. Central pulmonary arteries are well opacified. No evidence of a central pulmonary embolism. Heart is normal in size and configuration. No pericardial effusion. Three-vessel coronary artery calcifications. Mediastinum/Nodes: No enlarged mediastinal, hilar, or axillary lymph nodes. Thyroid gland, trachea, and esophagus demonstrate no significant findings. Lungs/Pleura: No pneumonia or pulmonary edema. Mild to moderate centrilobular emphysema. Apical pleuroparenchymal scarring. Small irregular nodular opacity in the right upper lobe, image 81, series 8, 3 mm. 3 mm similar appearing opacity in the more inferior right upper lobe, image 90. No other nodules. No masses. No pleural effusion or pneumothorax. Musculoskeletal: No fracture or acute finding. No bone lesion. No chest wall masses. Review of the MIP images confirms the above findings. CTA ABDOMEN AND  PELVIS FINDINGS VASCULAR Aorta: Atherosclerotic disease. No significant stenosis. No aneurysm or dissection. Celiac: Patent without evidence of aneurysm, dissection, vasculitis or significant stenosis. SMA: Patent without evidence of aneurysm, dissection, vasculitis or significant stenosis. Renals: Mild atherosclerotic change at the origin of the renal arteries, left greater than right without significant stenosis. No evidence of aneurysm, dissection or vasculitis. IMA: Small inferior mesenteric artery. Inflow: Diffuse atherosclerotic change along the common, external and internal iliac arteries. No significant stenosis. Veins: No obvious venous abnormality within the limitations of this arterial phase study. Review of the MIP images confirms the above findings. NON-VASCULAR Hepatobiliary: Normal in size and overall attenuation. Low-density lesion along the posterior aspect of segment 6, 1 cm, consistent with a cyst. No other masses or lesions. Normal gallbladder. No bile duct dilation. Pancreas: Unremarkable. No pancreatic ductal dilatation or surrounding inflammatory changes. Spleen: Normal in size without focal abnormality. Adrenals/Urinary Tract: Left adrenal mass, 1.7 cm, most likely an adenoma. Small right adrenal mass versus nodular hyperplasia. Kidneys normal in size, orientation and position. No masses, stones  or hydronephrosis. Normal ureters. Bladder minimally distended, otherwise unremarkable. Stomach/Bowel: Stomach moderately distended, otherwise unremarkable. Diverticulum from the second portion of the duodenum indents the pancreatic head lying just posterior to the distal common bile duct. Prominent enhancement the proximal to mid small bowel, no convincing wall thickening. Colon is normal in caliber. No wall thickening or inflammation. Numerous left colon diverticula mostly along the sigmoid. No evidence of diverticulitis. Appendix is prominent, 7 mm in diameter, but shows no wall thickening and  contains internal air. This is felt be a normal variant for this patient. Lymphatic: No enlarged lymph nodes. Reproductive: Mild prostate enlargement, 4.8 x 4.0 cm transversely. Other: Small amount of ascites collecting along the anterior margin of the liver, more notably adjacent to the spleen and in the posterior pelvic recess. Musculoskeletal: No fracture or acute finding.  No bone lesion. Review of the MIP images confirms the above findings. IMPRESSION: CTA FINDINGS 1. No aortic dissection or aneurysm. 2. No acute findings. 3. Aortic and branch vessel atherosclerosis. No significant stenosis. NON CTA FINDINGS 1. No acute findings in the chest. 2. Three-vessel coronary artery calcifications. 3. Moderate emphysema. 4. 2 small lung nodules on the right. No follow-up needed if patient is low-risk (and has no known or suspected primary neoplasm). Non-contrast chest CT can be considered in 12 months if patient is high-risk. This recommendation follows the consensus statement: Guidelines for Management of Incidental Pulmonary Nodules Detected on CT Images: From the Fleischner Society 2017; Radiology 2017; 284:228-243. 5. Abnormal appearance of the small bowel with significant wall enhancement. This is nonspecific, but consider small-bowel infection or inflammation in the proper clinical setting. There is no convincing wall thickening, however. 6. Small amount of ascites of unclear etiology. 7. Adrenal masses, left greater than right, most likely adenomas. 8. Left colon diverticulosis without evidence of diverticulitis. Electronically Signed   By: Lajean Manes M.D.   On: 12/21/2020 20:49     Microbiology: Recent Results (from the past 240 hour(s))  Blood culture (routine single)     Status: None (Preliminary result)   Collection Time: 12/21/20  8:29 PM   Specimen: BLOOD LEFT ARM  Result Value Ref Range Status   Specimen Description BLOOD LEFT ARM  Final   Special Requests   Final    BOTTLES DRAWN AEROBIC  AND ANAEROBIC Blood Culture adequate volume   Culture   Final    NO GROWTH < 24 HOURS Performed at [facility], [address], [place], [place] [address]    Report Status PENDING  Incomplete  Resp Panel by RT-PCR (Flu A&B, Covid) Nasopharyngeal Swab     Status: None   Collection Time: 12/21/20  8:43 PM   Specimen: Nasopharyngeal Swab; Nasopharyngeal(NP) swabs in vial transport medium  Result Value Ref Range Status   SARS Coronavirus 2 by RT PCR NEGATIVE NEGATIVE Final    Comment: (NOTE) SARS-CoV-2 target nucleic acids are NOT DETECTED.  The SARS-CoV-2 RNA is generally detectable in upper respiratory specimens during the acute phase of infection. The lowest concentration of SARS-CoV-2 viral copies this assay can detect is 138 copies/mL. A negative result does not preclude SARS-Cov-2 infection and should not be used as the sole basis for treatment or other patient management decisions. A negative result may occur with  improper specimen collection/handling, submission of specimen other than nasopharyngeal swab, presence of viral mutation(s) within the areas targeted by this assay, and inadequate number of viral copies(<138 copies/mL). A negative result must be combined with clinical observations, patient history,  and epidemiological information. The expected result is Negative.  Fact Sheet for Patients:  EntrepreneurPulse.com.au  Fact Sheet for Healthcare Providers:  IncredibleEmployment.be  This test is no t yet approved or cleared by the [place] FDA and  has been authorized for detection and/or diagnosis of SARS-CoV-2 by FDA under an Emergency Use Authorization (EUA). This EUA will remain  in effect (meaning this test can be used) for the duration of the COVID-19 declaration under Section 564(b)(1) of the Act, 21 U.S.C.section 360bbb-3(b)(1), unless the authorization is terminated  or revoked sooner.       Influenza A by PCR  NEGATIVE NEGATIVE Final   Influenza B by PCR NEGATIVE NEGATIVE Final    Comment: (NOTE) The Xpert Xpress SARS-CoV-2/FLU/RSV plus assay is intended as an aid in the diagnosis of influenza from Nasopharyngeal swab specimens and should not be used as a sole basis for treatment. Nasal washings and aspirates are unacceptable for Xpert Xpress SARS-CoV-2/FLU/RSV testing.  Fact Sheet for Patients: EntrepreneurPulse.com.au  Fact Sheet for Healthcare Providers: IncredibleEmployment.be  This test is not yet approved or cleared by the [place] FDA and has been authorized for detection and/or diagnosis of SARS-CoV-2 by FDA under an Emergency Use Authorization (EUA). This EUA will remain in effect (meaning this test can be used) for the duration of the COVID-19 declaration under Section 564(b)(1) of the Act, 21 U.S.C. section 360bbb-3(b)(1), unless the authorization is terminated or revoked.  Performed at [facility], [address], [place], [place] [address]      Labs: Basic Metabolic Panel: Recent Labs  Lab 12/21/20 2029 12/22/20 0519 12/23/20 0453  NA 133* 133* 135  K 4.1 4.0 4.1  CL 96* 102 104  CO2 27 24 24   GLUCOSE 138* 99 96  BUN 22 22 18   CREATININE 1.53* 0.96 0.78  CALCIUM 9.8 8.8* 8.8*  MG  --  2.1 1.9  PHOS  --  3.8  --    Liver Function Tests: Recent Labs  Lab 12/21/20 2029 12/22/20 0519 12/23/20 0453  AST 23 17 19   ALT 18 15 15   ALKPHOS 57 48 46  BILITOT 0.8 0.8 1.1  PROT 7.3 5.9* 6.0*  ALBUMIN 4.4 3.5 3.5   No results for input(s): LIPASE, AMYLASE in the last 168 hours. No results for input(s): AMMONIA in the last 168 hours. CBC: Recent Labs  Lab 12/21/20 2029 12/22/20 0519 12/23/20 0453  WBC 13.0* 10.9* 10.0  NEUTROABS 8.5*  --  6.5  HGB 18.9* 16.0 14.9  HCT 57.6* 47.8 44.3  MCV 95.5 94.5 94.7  PLT 191 144* 144*   Cardiac Enzymes: No results for input(s): CKTOTAL, CKMB, CKMBINDEX, TROPONINI in the last  168 hours. BNP: Invalid input(s): POCBNP CBG: No results for input(s): GLUCAP in the last 168 hours.  Time coordinating discharge:  36 minutes  Signed:  Orson Eva, DO [facility] Pager: [phone] 12/23/2020, 9:25 AM

## 2020-12-23 NOTE — Discharge Instructions (Signed)
Near-Syncope Near-syncope is when you suddenly get weak or dizzy, or you feel like you might pass out (faint). This may also be called presyncope. This is due to a lack of blood flow to the brain. During an episode of near-syncope, you may:  Feel dizzy, weak, or light-headed.  Feel sick to your stomach (nauseous).  See all white or all black.  See spots.  Have cold, clammy skin. This condition is caused by a sudden decrease in blood flow to the brain. This decrease can result from various causes, but most of those causes are not dangerous. However, near-syncope may be a sign of a serious medical problem, so it is important to seek medical care. Follow these instructions at home: Medicines  Take over-the-counter and prescription medicines only as told by your doctor.  If you are taking blood pressure or heart medicine, get up slowly and spend many minutes getting ready to sit and then stand. This can help with dizziness. General instructions  Be aware of any changes in your symptoms.  Talk with your doctor about your symptoms. You may need to have testing to find the cause of your near-syncope.  If you start to feel like you might pass out, lie down right away. Raise (elevate) your feet above the level of your heart. Breathe deeply and steadily. Wait until all of the symptoms are gone.  Have someone stay with you until you feel stable.  Do not drive, use machinery, or play sports until your doctor says it is okay.  Drink enough fluid to keep your pee (urine) pale yellow.  Keep all follow-up visits as told by your doctor. This is important. Get help right away if you:  Have a seizure.  Have pain in your: ? Chest. ? Belly (abdomen). ? Back.  Faint once or more than once.  Have a very bad headache.  Are bleeding from your mouth or butt.  Have black or tarry poop (stool).  Have a very fast or uneven heartbeat (palpitations).  Are mixed up (confused).  Have trouble  walking.  Are very weak.  Have trouble seeing. These symptoms may be an emergency. Do not wait to see if the symptoms will go away. Get medical help right away. Call your local emergency services (911 in the [place] Do not drive yourself to the hospital. Summary  Near-syncope is when you suddenly get weak or dizzy, or you feel like you might pass out (faint).  This condition is caused by a lack of blood flow to the brain.  Near-syncope may be a sign of a serious medical problem, so it is important to seek medical care. This information is not intended to replace advice given to you by your health care provider. Make sure you discuss any questions you have with your health care provider. Document Revised: 02/18/2019 Document Reviewed: 09/15/2018 Elsevier Patient Education  2021 [employer].

## 2020-12-26 LAB — CULTURE, BLOOD (SINGLE)
Culture: NO GROWTH
Special Requests: ADEQUATE

## 2021-01-01 DIAGNOSIS — Z8673 Personal history of transient ischemic attack (TIA), and cerebral infarction without residual deficits: Secondary | ICD-10-CM | POA: Diagnosis not present

## 2021-01-01 DIAGNOSIS — I10 Essential (primary) hypertension: Secondary | ICD-10-CM | POA: Diagnosis not present

## 2021-01-01 DIAGNOSIS — J410 Simple chronic bronchitis: Secondary | ICD-10-CM | POA: Diagnosis not present

## 2021-01-01 DIAGNOSIS — I700 Atherosclerosis of aorta: Secondary | ICD-10-CM | POA: Diagnosis not present

## 2021-01-04 DIAGNOSIS — I10 Essential (primary) hypertension: Secondary | ICD-10-CM | POA: Diagnosis not present

## 2021-01-04 DIAGNOSIS — Z0001 Encounter for general adult medical examination with abnormal findings: Secondary | ICD-10-CM | POA: Diagnosis not present

## 2021-01-29 DIAGNOSIS — I10 Essential (primary) hypertension: Secondary | ICD-10-CM | POA: Diagnosis not present

## 2021-01-29 DIAGNOSIS — R69 Illness, unspecified: Secondary | ICD-10-CM | POA: Diagnosis not present

## 2021-03-08 DIAGNOSIS — R69 Illness, unspecified: Secondary | ICD-10-CM | POA: Diagnosis not present

## 2021-03-08 DIAGNOSIS — E785 Hyperlipidemia, unspecified: Secondary | ICD-10-CM | POA: Diagnosis not present

## 2021-03-08 DIAGNOSIS — I10 Essential (primary) hypertension: Secondary | ICD-10-CM | POA: Diagnosis not present

## 2021-03-08 DIAGNOSIS — I700 Atherosclerosis of aorta: Secondary | ICD-10-CM | POA: Diagnosis not present

## 2021-04-07 DIAGNOSIS — R69 Illness, unspecified: Secondary | ICD-10-CM | POA: Diagnosis not present

## 2021-04-07 DIAGNOSIS — I10 Essential (primary) hypertension: Secondary | ICD-10-CM | POA: Diagnosis not present

## 2021-05-08 DIAGNOSIS — I10 Essential (primary) hypertension: Secondary | ICD-10-CM | POA: Diagnosis not present

## 2021-05-08 DIAGNOSIS — R69 Illness, unspecified: Secondary | ICD-10-CM | POA: Diagnosis not present

## 2021-06-07 DIAGNOSIS — R69 Illness, unspecified: Secondary | ICD-10-CM | POA: Diagnosis not present

## 2021-06-07 DIAGNOSIS — I10 Essential (primary) hypertension: Secondary | ICD-10-CM | POA: Diagnosis not present

## 2021-07-08 DIAGNOSIS — R69 Illness, unspecified: Secondary | ICD-10-CM | POA: Diagnosis not present

## 2021-07-08 DIAGNOSIS — I10 Essential (primary) hypertension: Secondary | ICD-10-CM | POA: Diagnosis not present

## 2021-08-01 DIAGNOSIS — Z8673 Personal history of transient ischemic attack (TIA), and cerebral infarction without residual deficits: Secondary | ICD-10-CM | POA: Diagnosis not present

## 2021-08-01 DIAGNOSIS — Z23 Encounter for immunization: Secondary | ICD-10-CM | POA: Diagnosis not present

## 2021-08-01 DIAGNOSIS — J410 Simple chronic bronchitis: Secondary | ICD-10-CM | POA: Diagnosis not present

## 2021-08-01 DIAGNOSIS — I10 Essential (primary) hypertension: Secondary | ICD-10-CM | POA: Diagnosis not present

## 2021-08-01 DIAGNOSIS — E785 Hyperlipidemia, unspecified: Secondary | ICD-10-CM | POA: Diagnosis not present

## 2021-08-31 DIAGNOSIS — I10 Essential (primary) hypertension: Secondary | ICD-10-CM | POA: Diagnosis not present

## 2021-08-31 DIAGNOSIS — Z8673 Personal history of transient ischemic attack (TIA), and cerebral infarction without residual deficits: Secondary | ICD-10-CM | POA: Diagnosis not present

## 2021-09-26 DIAGNOSIS — E785 Hyperlipidemia, unspecified: Secondary | ICD-10-CM | POA: Diagnosis not present

## 2021-09-26 DIAGNOSIS — I10 Essential (primary) hypertension: Secondary | ICD-10-CM | POA: Diagnosis not present

## 2021-09-26 DIAGNOSIS — I700 Atherosclerosis of aorta: Secondary | ICD-10-CM | POA: Diagnosis not present

## 2021-10-01 DIAGNOSIS — I10 Essential (primary) hypertension: Secondary | ICD-10-CM | POA: Diagnosis not present

## 2021-10-01 DIAGNOSIS — R69 Illness, unspecified: Secondary | ICD-10-CM | POA: Diagnosis not present

## 2021-10-15 DIAGNOSIS — E785 Hyperlipidemia, unspecified: Secondary | ICD-10-CM | POA: Diagnosis not present

## 2021-10-15 DIAGNOSIS — Z1331 Encounter for screening for depression: Secondary | ICD-10-CM | POA: Diagnosis not present

## 2021-10-15 DIAGNOSIS — F17210 Nicotine dependence, cigarettes, uncomplicated: Secondary | ICD-10-CM | POA: Diagnosis not present

## 2021-10-15 DIAGNOSIS — Z0001 Encounter for general adult medical examination with abnormal findings: Secondary | ICD-10-CM | POA: Diagnosis not present

## 2021-10-15 DIAGNOSIS — Z8673 Personal history of transient ischemic attack (TIA), and cerebral infarction without residual deficits: Secondary | ICD-10-CM | POA: Diagnosis not present

## 2021-10-15 DIAGNOSIS — Z1389 Encounter for screening for other disorder: Secondary | ICD-10-CM | POA: Diagnosis not present

## 2021-10-15 DIAGNOSIS — I10 Essential (primary) hypertension: Secondary | ICD-10-CM | POA: Diagnosis not present

## 2021-10-15 DIAGNOSIS — R69 Illness, unspecified: Secondary | ICD-10-CM | POA: Diagnosis not present

## 2021-10-24 DIAGNOSIS — I10 Essential (primary) hypertension: Secondary | ICD-10-CM | POA: Diagnosis not present

## 2021-10-30 IMAGING — CT CT ANGIO CHEST-ABD-PELV FOR DISSECTION W/ AND WO/W CM
2 of 7 series · 12 of 46 positions shown, 14 images · IV contrast (Omnipaque or Isovue)
Comparison: None.

CLINICAL DATA: Pt was sitting at dinner when all of sudden became
weak, incontinence, slurred speech, diaphoretic. BP 60/9EJbdominal
pain, aortic dissection suspected

EXAM:
CT ANGIOGRAPHY CHEST, ABDOMEN AND PELVIS
TECHNIQUE: Non-contrast CT of the chest was initially obtained.

[Series 5: axial arterial · axial · arterial · 0.80mm/px · z∈[+779,+1373]mm · 9 of 232 slices shown, 11 images]
[im 17/232  soft-tissue]
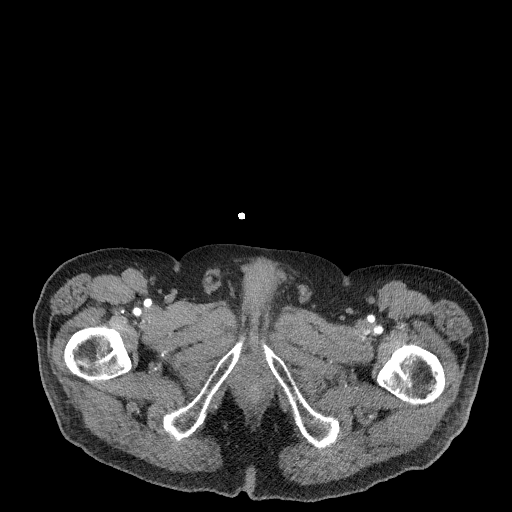
[im 17/232  bone]
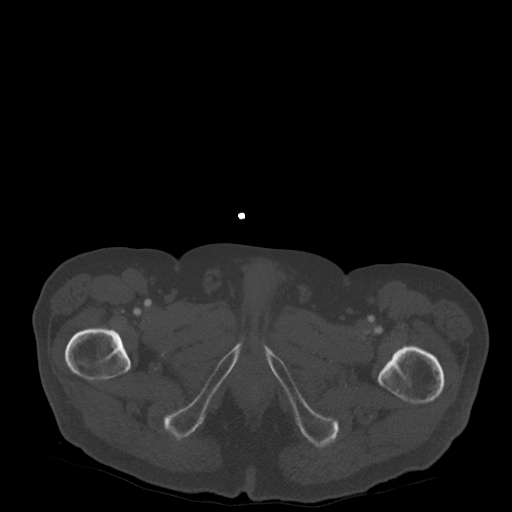
[im 50/232  soft-tissue]
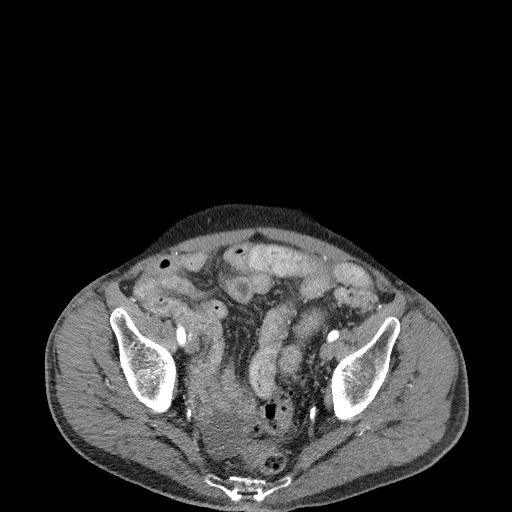
[im 67/232  soft-tissue]
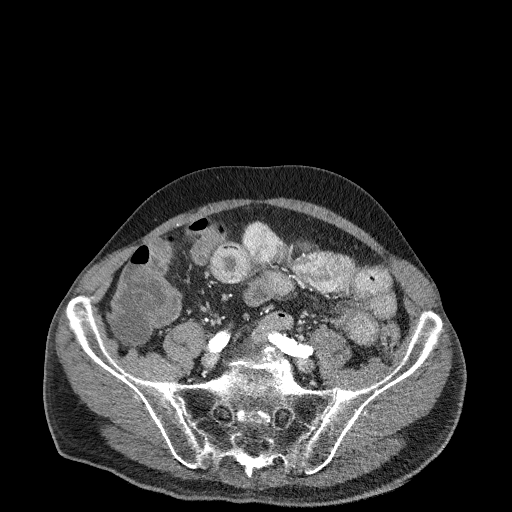
[im 100/232  soft-tissue]
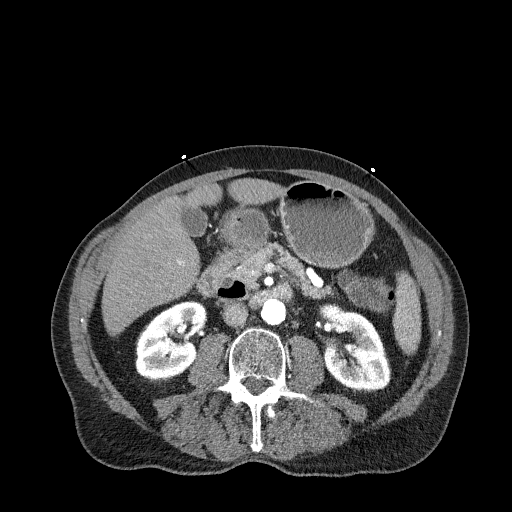
[im 116/232  soft-tissue]
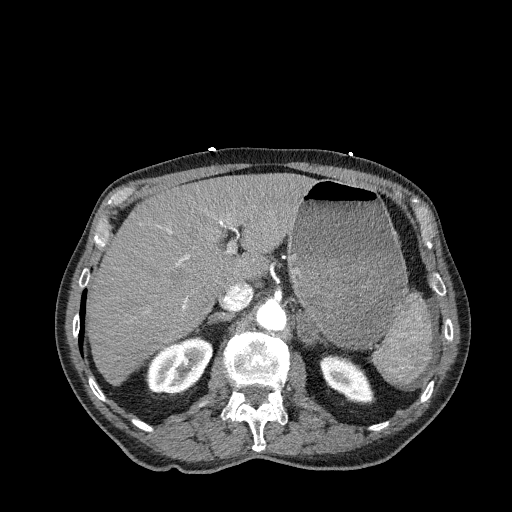
[im 133/232  soft-tissue]
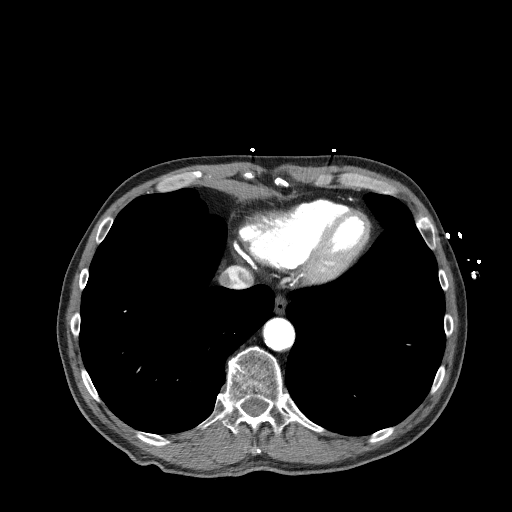
[im 166/232  soft-tissue]
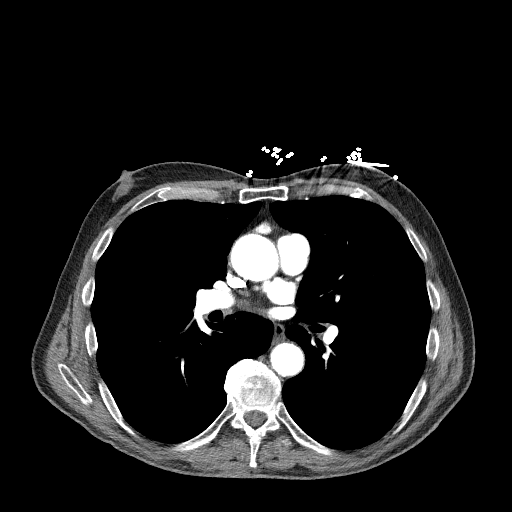
[im 182/232  soft-tissue]
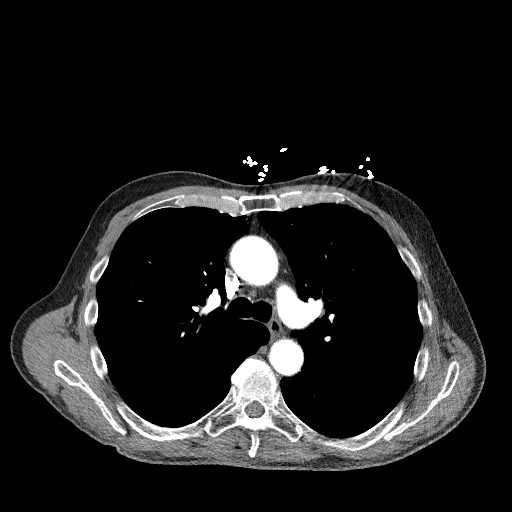
[im 215/232  soft-tissue]
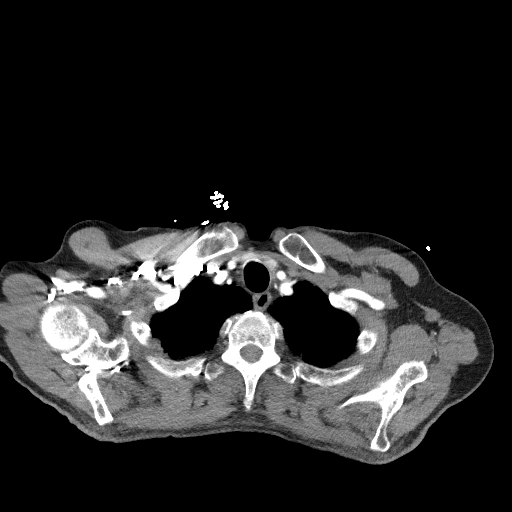
[im 215/232  bone]
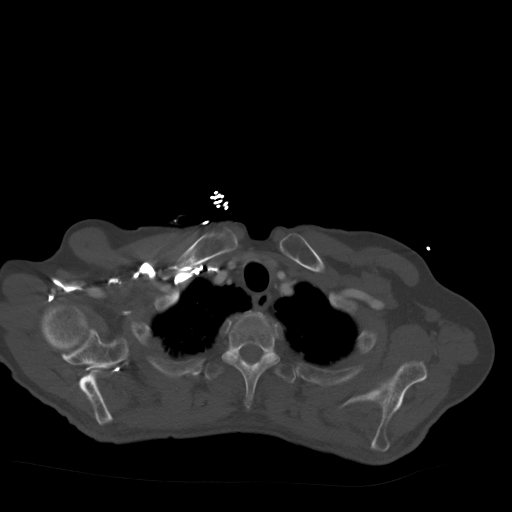

[Series 10: cor soft · coronal · 0.82mm/px · 3 of 142 slices shown]
[im 36/142  soft-tissue]
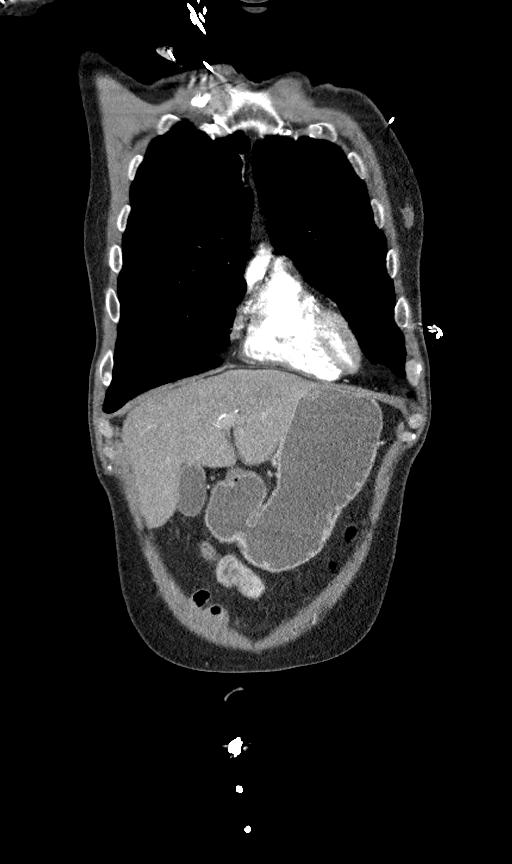
[im 71/142  soft-tissue]
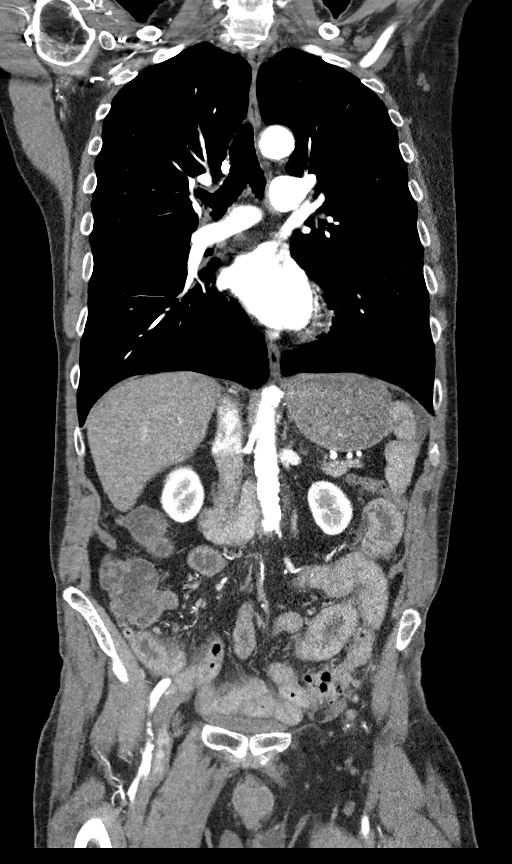
[im 106/142  soft-tissue]
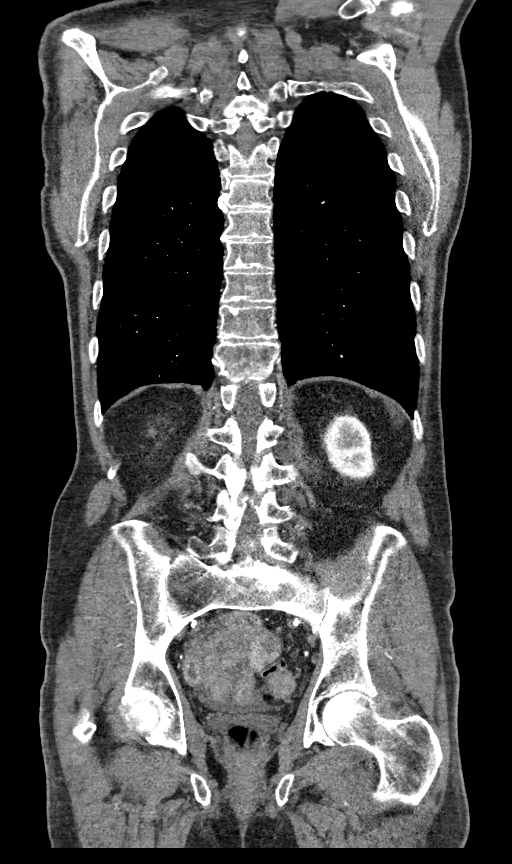

[12 of 46 positions shown; findings below may reference images not displayed]

Multidetector CT imaging through the chest, abdomen and pelvis was
performed using the standard protocol during bolus administration of
intravenous contrast. Multiplanar reconstructed images and MIPs were
obtained and reviewed to evaluate the vascular anatomy.

CONTRAST:  100mL OMNIPAQUE IOHEXOL 350 MG/ML SOLN
FINDINGS: CTA CHEST FINDINGS

Cardiovascular: No aortic dissection. No aortic aneurysm. Aortic
atherosclerosis without stenosis. There are atherosclerotic changes
the arch branch vessels with no significant stenosis. Central
pulmonary arteries are well opacified. No evidence of a central
pulmonary embolism.

Heart is normal in size and configuration. No pericardial effusion.
Three-vessel coronary artery calcifications.

Mediastinum/Nodes: No enlarged mediastinal, hilar, or axillary lymph
nodes. Thyroid gland, trachea, and esophagus demonstrate no
significant findings.

Lungs/Pleura: No pneumonia or pulmonary edema. Mild to moderate
centrilobular emphysema. Apical pleuroparenchymal scarring. Small
irregular nodular opacity in the right upper lobe, image 81, series
8, 3 mm. 3 mm similar appearing opacity in the more inferior right
upper lobe, image 90. No other nodules. No masses. No pleural
effusion or pneumothorax.

Musculoskeletal: No fracture or acute finding. No bone lesion. No
chest wall masses.

Review of the MIP images confirms the above findings.

CTA ABDOMEN AND PELVIS FINDINGS

VASCULAR

Aorta: Atherosclerotic disease. No significant stenosis. No aneurysm
or dissection.

Celiac: Patent without evidence of aneurysm, dissection, vasculitis
or significant stenosis.

SMA: Patent without evidence of aneurysm, dissection, vasculitis or
significant stenosis.

Renals: Mild atherosclerotic change at the origin of the renal
arteries, left greater than right without significant stenosis. No
evidence of aneurysm, dissection or vasculitis.

IMA: Small inferior mesenteric artery.

Inflow: Diffuse atherosclerotic change along the common, external
and internal iliac arteries. No significant stenosis.

Veins: No obvious venous abnormality within the limitations of this
arterial phase study.

Review of the MIP images confirms the above findings.

NON-VASCULAR

Hepatobiliary: Normal in size and overall attenuation. Low-density
lesion along the posterior aspect of segment 6, 1 cm, consistent
with a cyst. No other masses or lesions. Normal gallbladder. No bile
duct dilation.

Pancreas: Unremarkable. No pancreatic ductal dilatation or
surrounding inflammatory changes.

Spleen: Normal in size without focal abnormality.

Adrenals/Urinary Tract: Left adrenal mass, 1.7 cm, most likely an
adenoma. Small right adrenal mass versus nodular hyperplasia.

Kidneys normal in size, orientation and position. No masses, stones
or hydronephrosis. Normal ureters. Bladder minimally distended,
otherwise unremarkable.

Stomach/Bowel: Stomach moderately distended, otherwise unremarkable.
Diverticulum from the second portion of the duodenum indents the
pancreatic head lying just posterior to the distal common bile duct.

Prominent enhancement the proximal to mid small bowel, no convincing
wall thickening. Colon is normal in caliber. No wall thickening or
inflammation. Numerous left colon diverticula mostly along the
sigmoid. No evidence of diverticulitis. Appendix is prominent, 7 mm
in diameter, but shows no wall thickening and contains internal air.
This is felt be a normal variant for this patient.

Lymphatic: No enlarged lymph nodes.

Reproductive: Mild prostate enlargement, 4.8 x 4.0 cm transversely.

Other: Small amount of ascites collecting along the anterior margin
of the liver, more notably adjacent to the spleen and in the
posterior pelvic recess.

Musculoskeletal: No fracture or acute finding.  No bone lesion.

Review of the MIP images confirms the above findings.
IMPRESSION: CTA FINDINGS

1. No aortic dissection or aneurysm.
2. No acute findings.
3. Aortic and branch vessel atherosclerosis. No significant
stenosis.

NON CTA FINDINGS

1. No acute findings in the chest.
2. Three-vessel coronary artery calcifications.
3. Moderate emphysema.
4. 2 small lung nodules on the right. No follow-up needed if patient
is low-risk (and has no known or suspected primary neoplasm).
Non-contrast chest CT can be considered in 12 months if patient is
high-risk. This recommendation follows the consensus statement:
Guidelines for Management of Incidental Pulmonary Nodules Detected
[DATE].
5. Abnormal appearance of the small bowel with significant wall
enhancement. This is nonspecific, but consider small-bowel infection
or inflammation in the proper clinical setting. There is no
convincing wall thickening, however.
6. Small amount of ascites of unclear etiology.
7. Adrenal masses, left greater than right, most likely adenomas.
8. Left colon diverticulosis without evidence of diverticulitis.

## 2021-10-30 IMAGING — DX DG CHEST 1V PORT
1 series · 2 of 2 positions shown · non-contrast
Comparison: CT 12/21/2020

CLINICAL DATA: Sudden weakness, hypotensive, diaphoresis, current
smoker, testing for COVID 19

EXAM:
PORTABLE CHEST 1 VIEW

[Series 1: chest ap · 0.14mm/px · 2 of 2 slices shown]
[im 1/2]
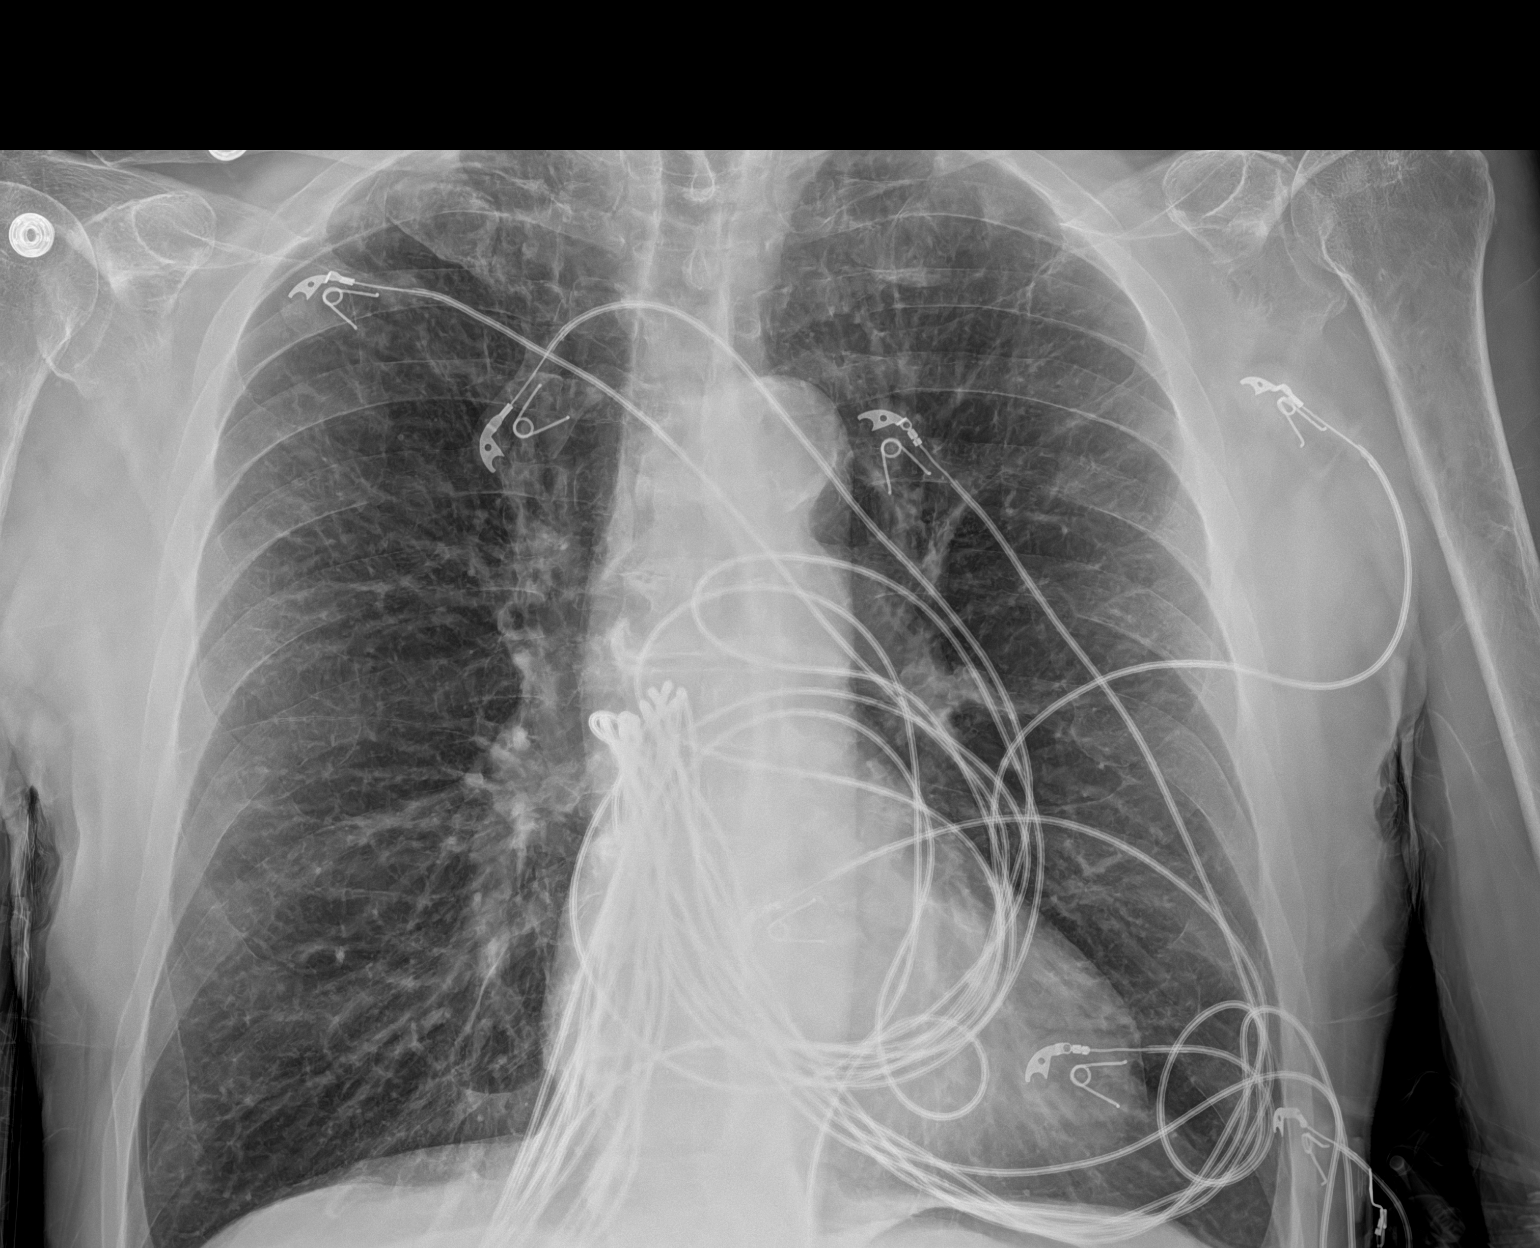
[im 2/2]
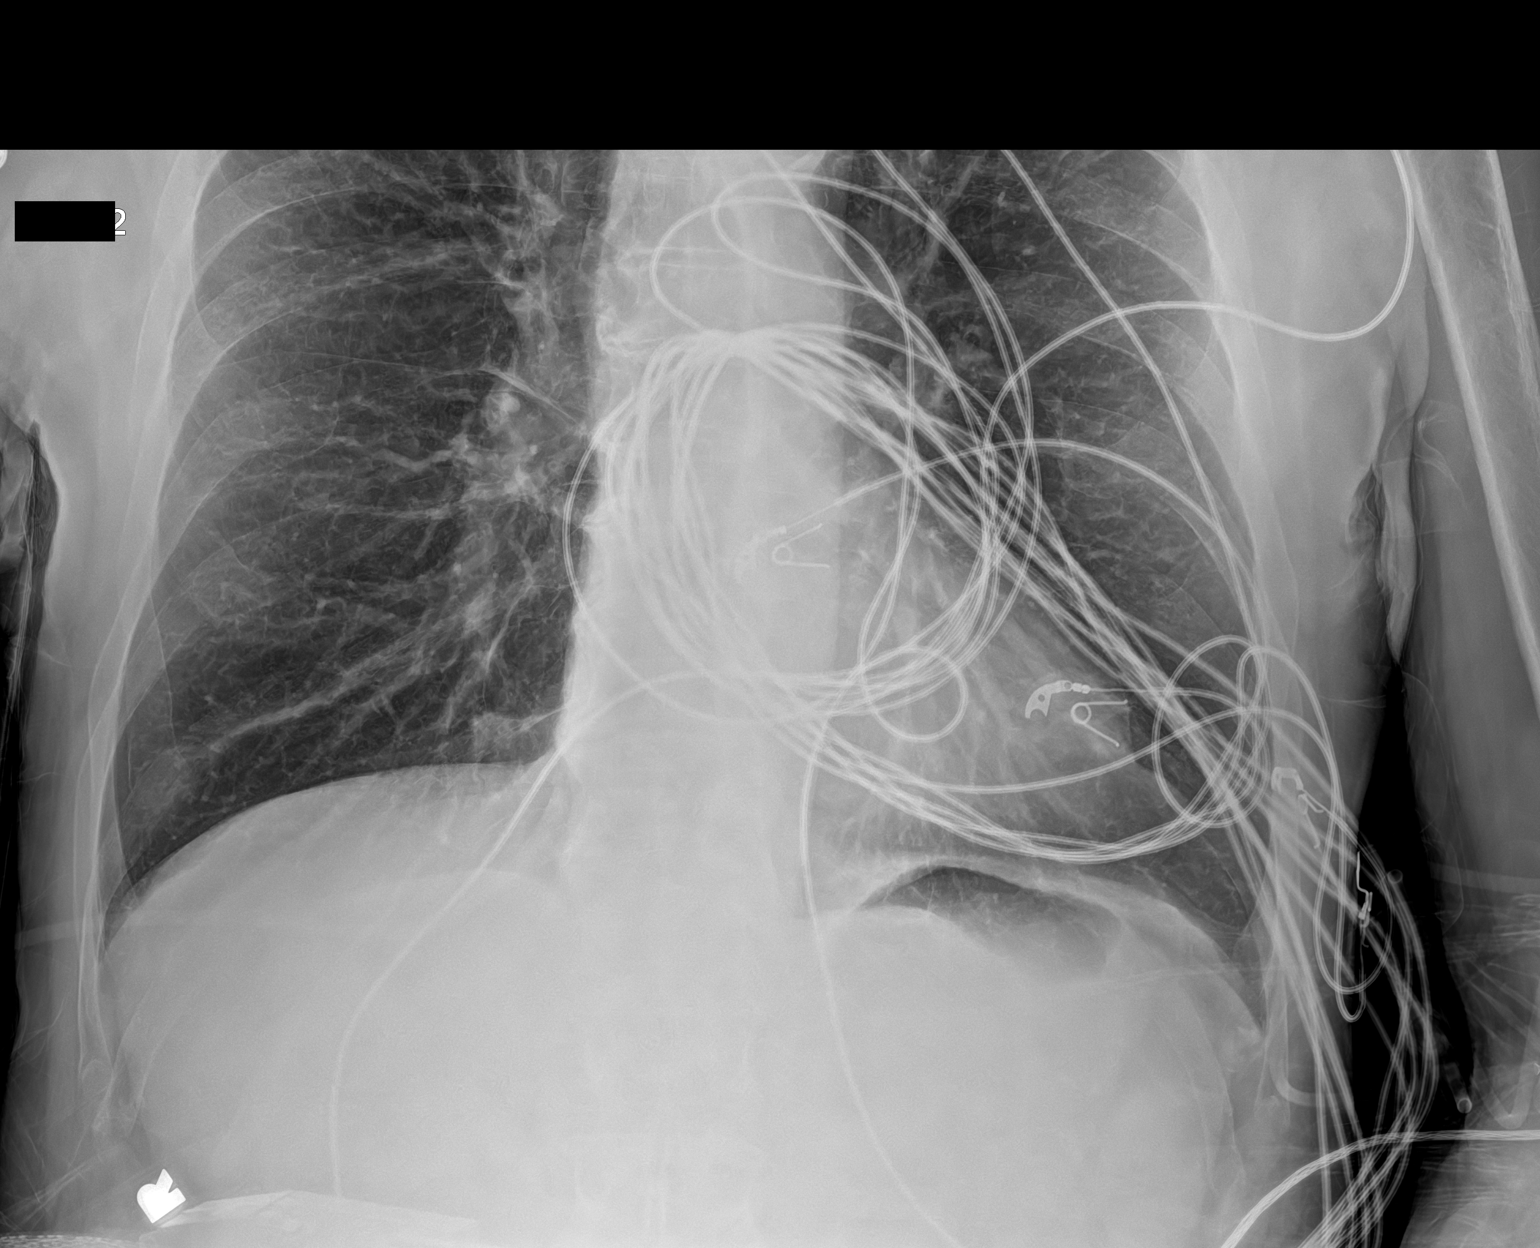

[2 of 2 positions shown; findings below may reference images not displayed]

FINDINGS: Coarsened reticular changes and bronchitic features are seen
throughout the chest, slightly increased from prior imaging.
Biapical pleuroparenchymal scarring is similar to comparison. No
pneumothorax. No effusion. The aorta is calcified. The remaining
cardiomediastinal contours are unremarkable. No acute osseous
abnormality or suspicious osseous lesion. Degenerative changes are
present in the imaged spine and shoulders. Telemetry leads overlie
the chest.
IMPRESSION: Slightly coarsened reticular and bronchitic features. No
consolidative process or features of edema.

Aortic Atherosclerosis ([email or web address].L).

## 2021-11-15 DIAGNOSIS — I10 Essential (primary) hypertension: Secondary | ICD-10-CM | POA: Diagnosis not present

## 2021-11-15 DIAGNOSIS — R69 Illness, unspecified: Secondary | ICD-10-CM | POA: Diagnosis not present

## 2021-12-16 DIAGNOSIS — I10 Essential (primary) hypertension: Secondary | ICD-10-CM | POA: Diagnosis not present

## 2021-12-16 DIAGNOSIS — R69 Illness, unspecified: Secondary | ICD-10-CM | POA: Diagnosis not present

## 2022-01-13 DIAGNOSIS — I10 Essential (primary) hypertension: Secondary | ICD-10-CM | POA: Diagnosis not present

## 2022-01-13 DIAGNOSIS — Z8673 Personal history of transient ischemic attack (TIA), and cerebral infarction without residual deficits: Secondary | ICD-10-CM | POA: Diagnosis not present

## 2022-02-13 DIAGNOSIS — E785 Hyperlipidemia, unspecified: Secondary | ICD-10-CM | POA: Diagnosis not present

## 2022-02-13 DIAGNOSIS — I10 Essential (primary) hypertension: Secondary | ICD-10-CM | POA: Diagnosis not present

## 2022-03-15 DIAGNOSIS — R69 Illness, unspecified: Secondary | ICD-10-CM | POA: Diagnosis not present

## 2022-03-15 DIAGNOSIS — I10 Essential (primary) hypertension: Secondary | ICD-10-CM | POA: Diagnosis not present

## 2022-04-24 DIAGNOSIS — I700 Atherosclerosis of aorta: Secondary | ICD-10-CM | POA: Diagnosis not present

## 2022-04-24 DIAGNOSIS — E785 Hyperlipidemia, unspecified: Secondary | ICD-10-CM | POA: Diagnosis not present

## 2022-04-24 DIAGNOSIS — F17210 Nicotine dependence, cigarettes, uncomplicated: Secondary | ICD-10-CM | POA: Diagnosis not present

## 2022-04-24 DIAGNOSIS — J410 Simple chronic bronchitis: Secondary | ICD-10-CM | POA: Diagnosis not present

## 2022-04-24 DIAGNOSIS — R69 Illness, unspecified: Secondary | ICD-10-CM | POA: Diagnosis not present

## 2022-04-24 DIAGNOSIS — I10 Essential (primary) hypertension: Secondary | ICD-10-CM | POA: Diagnosis not present

## 2022-05-24 DIAGNOSIS — E785 Hyperlipidemia, unspecified: Secondary | ICD-10-CM | POA: Diagnosis not present

## 2022-05-24 DIAGNOSIS — I10 Essential (primary) hypertension: Secondary | ICD-10-CM | POA: Diagnosis not present

## 2022-06-24 DIAGNOSIS — I10 Essential (primary) hypertension: Secondary | ICD-10-CM | POA: Diagnosis not present

## 2022-06-24 DIAGNOSIS — I700 Atherosclerosis of aorta: Secondary | ICD-10-CM | POA: Diagnosis not present

## 2022-07-25 DIAGNOSIS — E785 Hyperlipidemia, unspecified: Secondary | ICD-10-CM | POA: Diagnosis not present

## 2022-07-25 DIAGNOSIS — I10 Essential (primary) hypertension: Secondary | ICD-10-CM | POA: Diagnosis not present

## 2022-08-24 DIAGNOSIS — I10 Essential (primary) hypertension: Secondary | ICD-10-CM | POA: Diagnosis not present

## 2022-08-24 DIAGNOSIS — E785 Hyperlipidemia, unspecified: Secondary | ICD-10-CM | POA: Diagnosis not present

## 2022-09-24 DIAGNOSIS — I10 Essential (primary) hypertension: Secondary | ICD-10-CM | POA: Diagnosis not present

## 2022-09-24 DIAGNOSIS — E785 Hyperlipidemia, unspecified: Secondary | ICD-10-CM | POA: Diagnosis not present

## 2022-10-10 DIAGNOSIS — I10 Essential (primary) hypertension: Secondary | ICD-10-CM | POA: Diagnosis not present

## 2022-10-10 DIAGNOSIS — E785 Hyperlipidemia, unspecified: Secondary | ICD-10-CM | POA: Diagnosis not present

## 2022-10-10 DIAGNOSIS — Z1159 Encounter for screening for other viral diseases: Secondary | ICD-10-CM | POA: Diagnosis not present

## 2022-10-10 DIAGNOSIS — Z8673 Personal history of transient ischemic attack (TIA), and cerebral infarction without residual deficits: Secondary | ICD-10-CM | POA: Diagnosis not present

## 2022-10-10 DIAGNOSIS — Z0001 Encounter for general adult medical examination with abnormal findings: Secondary | ICD-10-CM | POA: Diagnosis not present

## 2022-10-21 ENCOUNTER — Other Ambulatory Visit (HOSPITAL_COMMUNITY): Payer: Self-pay | Admitting: Gerontology

## 2022-10-21 DIAGNOSIS — Z23 Encounter for immunization: Secondary | ICD-10-CM | POA: Diagnosis not present

## 2022-10-21 DIAGNOSIS — R69 Illness, unspecified: Secondary | ICD-10-CM | POA: Diagnosis not present

## 2022-10-21 DIAGNOSIS — R918 Other nonspecific abnormal finding of lung field: Secondary | ICD-10-CM | POA: Diagnosis not present

## 2022-10-21 DIAGNOSIS — J449 Chronic obstructive pulmonary disease, unspecified: Secondary | ICD-10-CM | POA: Diagnosis not present

## 2022-10-21 DIAGNOSIS — Z8673 Personal history of transient ischemic attack (TIA), and cerebral infarction without residual deficits: Secondary | ICD-10-CM | POA: Diagnosis not present

## 2022-10-21 DIAGNOSIS — F17210 Nicotine dependence, cigarettes, uncomplicated: Secondary | ICD-10-CM | POA: Diagnosis not present

## 2022-10-21 DIAGNOSIS — Z0001 Encounter for general adult medical examination with abnormal findings: Secondary | ICD-10-CM | POA: Diagnosis not present

## 2022-10-21 DIAGNOSIS — Z1331 Encounter for screening for depression: Secondary | ICD-10-CM | POA: Diagnosis not present

## 2022-10-21 DIAGNOSIS — Z1389 Encounter for screening for other disorder: Secondary | ICD-10-CM | POA: Diagnosis not present

## 2022-10-21 DIAGNOSIS — I10 Essential (primary) hypertension: Secondary | ICD-10-CM | POA: Diagnosis not present

## 2022-10-21 DIAGNOSIS — Z87891 Personal history of nicotine dependence: Secondary | ICD-10-CM

## 2022-10-21 DIAGNOSIS — I700 Atherosclerosis of aorta: Secondary | ICD-10-CM | POA: Diagnosis not present

## 2022-10-21 DIAGNOSIS — F17200 Nicotine dependence, unspecified, uncomplicated: Secondary | ICD-10-CM | POA: Diagnosis not present

## 2022-11-21 DIAGNOSIS — I10 Essential (primary) hypertension: Secondary | ICD-10-CM | POA: Diagnosis not present

## 2022-11-21 DIAGNOSIS — J449 Chronic obstructive pulmonary disease, unspecified: Secondary | ICD-10-CM | POA: Diagnosis not present

## 2022-12-02 ENCOUNTER — Ambulatory Visit (HOSPITAL_COMMUNITY)
Admission: RE | Admit: 2022-12-02 | Discharge: 2022-12-02 | Disposition: A | Discharge status: Home / Self Care | Payer: Medicare HMO | Source: Ambulatory Visit | Attending: Gerontology | Admitting: Gerontology

## 2022-12-02 DIAGNOSIS — F17210 Nicotine dependence, cigarettes, uncomplicated: Secondary | ICD-10-CM | POA: Diagnosis not present

## 2022-12-02 DIAGNOSIS — Z87891 Personal history of nicotine dependence: Secondary | ICD-10-CM | POA: Diagnosis not present

## 2022-12-02 DIAGNOSIS — R69 Illness, unspecified: Secondary | ICD-10-CM | POA: Diagnosis not present

## 2022-12-22 DIAGNOSIS — I10 Essential (primary) hypertension: Secondary | ICD-10-CM | POA: Diagnosis not present

## 2022-12-22 DIAGNOSIS — J449 Chronic obstructive pulmonary disease, unspecified: Secondary | ICD-10-CM | POA: Diagnosis not present

## 2023-02-09 DEATH — deceased
